# Patient Record
Sex: Female | Born: 2000 | Race: Black or African American | Hispanic: No | Marital: Single | Smoking: Former smoker
Health system: Southern US, Community
[De-identification: ages and names within clinical notes are randomized; demographics above are authoritative.]

## PROBLEM LIST (undated history)

## (undated) DIAGNOSIS — Z789 Other specified health status: Secondary | ICD-10-CM

## (undated) HISTORY — PX: NO PAST SURGERIES: SHX2092

---
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
## 2019-01-26 ENCOUNTER — Encounter (HOSPITAL_COMMUNITY): Payer: Self-pay | Admitting: *Deleted

## 2019-01-26 ENCOUNTER — Inpatient Hospital Stay (HOSPITAL_COMMUNITY)
Admission: AD | Admit: 2019-01-26 | Discharge: 2019-01-27 | Disposition: A | Discharge status: Home / Self Care | Payer: Medicaid Other | Attending: Obstetrics & Gynecology | Admitting: Obstetrics & Gynecology

## 2019-01-26 DIAGNOSIS — O034 Incomplete spontaneous abortion without complication: Secondary | ICD-10-CM | POA: Insufficient documentation

## 2019-01-26 DIAGNOSIS — O209 Hemorrhage in early pregnancy, unspecified: Secondary | ICD-10-CM | POA: Diagnosis present

## 2019-01-26 HISTORY — DX: Other specified health status: Z78.9

## 2019-01-26 LAB — POCT PREGNANCY, URINE: Preg Test, Ur: POSITIVE — AB

## 2019-01-26 NOTE — MAU Note (Signed)
Pt reports she was passing several quarter sized blood clots before she called EMS. Had a miscarriage 3 months ago and she had the same thing of passing blood clots. Had explanon removed  Before the first miscarriage (unsure when. ) has not had a period since its removal. Did not get a HPT so she is not sure if she is pregnant. Re;ports mild abd cramping

## 2019-01-27 DIAGNOSIS — O034 Incomplete spontaneous abortion without complication: Secondary | ICD-10-CM

## 2019-01-27 LAB — TYPE AND SCREEN
ABO/RH(D): A POS
Antibody Screen: POSITIVE

## 2019-01-27 LAB — CBC
HCT: 33.4 % — ABNORMAL LOW (ref 36.0–46.0)
Hemoglobin: 12 g/dL (ref 12.0–15.0)
MCH: 31.7 pg (ref 26.0–34.0)
MCHC: 35.9 g/dL (ref 30.0–36.0)
MCV: 88.1 fL (ref 80.0–100.0)
Platelets: 177 10*3/uL (ref 150–400)
RBC: 3.79 MIL/uL — ABNORMAL LOW (ref 3.87–5.11)
RDW: 12.5 % (ref 11.5–15.5)
WBC: 11 10*3/uL — ABNORMAL HIGH (ref 4.0–10.5)
nRBC: 0 % (ref 0.0–0.2)

## 2019-01-27 LAB — HCG, QUANTITATIVE, PREGNANCY: hCG, Beta Chain, Quant, S: 2400 m[IU]/mL — ABNORMAL HIGH (ref <5)

## 2019-01-27 NOTE — Discharge Instructions (Signed)
Contraception Choices Contraception, also called birth control, refers to methods or devices that prevent pregnancy. Hormonal methods Contraceptive implant  A contraceptive implant is a thin, plastic tube that contains a hormone. It is inserted into the upper part of the arm. It can remain in place for up to 3 years. Progestin-only injections Progestin-only injections are injections of progestin, a synthetic form of the hormone progesterone. They are given every 3 months by a health care provider. Birth control pills  Birth control pills are pills that contain hormones that prevent pregnancy. They must be taken once a day, preferably at the same time each day. Birth control patch  The birth control patch contains hormones that prevent pregnancy. It is placed on the skin and must be changed once a week for three weeks and removed on the fourth week. A prescription is needed to use this method of contraception. Vaginal ring  A vaginal ring contains hormones that prevent pregnancy. It is placed in the vagina for three weeks and removed on the fourth week. After that, the process is repeated with a new ring. A prescription is needed to use this method of contraception. Emergency contraceptive Emergency contraceptives prevent pregnancy after unprotected sex. They come in pill form and can be taken up to 5 days after sex. They work best the sooner they are taken after having sex. Most emergency contraceptives are available without a prescription. This method should not be used as your only form of birth control. Barrier methods Female condom  A female condom is a thin sheath that is worn over the penis during sex. Condoms keep sperm from going inside a woman's body. They can be used with a spermicide to increase their effectiveness. They should be disposed after a single use. Female condom  A female condom is a soft, loose-fitting sheath that is put into the vagina before sex. The condom keeps sperm  from going inside a woman's body. They should be disposed after a single use. Diaphragm  A diaphragm is a soft, dome-shaped barrier. It is inserted into the vagina before sex, along with a spermicide. The diaphragm blocks sperm from entering the uterus, and the spermicide kills sperm. A diaphragm should be left in the vagina for 6-8 hours after sex and removed within 24 hours. A diaphragm is prescribed and fitted by a health care provider. A diaphragm should be replaced every 1-2 years, after giving birth, after gaining more than 15 lb (6.8 kg), and after pelvic surgery. Cervical cap  A cervical cap is a round, soft latex or plastic cup that fits over the cervix. It is inserted into the vagina before sex, along with spermicide. It blocks sperm from entering the uterus. The cap should be left in place for 6-8 hours after sex and removed within 48 hours. A cervical cap must be prescribed and fitted by a health care provider. It should be replaced every 2 years. Sponge  A sponge is a soft, circular piece of polyurethane foam with spermicide on it. The sponge helps block sperm from entering the uterus, and the spermicide kills sperm. To use it, you make it wet and then insert it into the vagina. It should be inserted before sex, left in for at least 6 hours after sex, and removed and thrown away within 30 hours. Spermicides Spermicides are chemicals that kill or block sperm from entering the cervix and uterus. They can come as a cream, jelly, suppository, foam, or tablet. A spermicide should be inserted into the  vagina with an applicator at least 86-57 minutes before sex to allow time for it to work. The process must be repeated every time you have sex. Spermicides do not require a prescription. Intrauterine contraception Intrauterine device (IUD) An IUD is a T-shaped device that is put in a woman's uterus. There are two types:  Hormone IUD.This type contains progestin, a synthetic form of the hormone  progesterone. This type can stay in place for 3-5 years.  Copper IUD.This type is wrapped in copper wire. It can stay in place for 10 years.  Permanent methods of contraception Female tubal ligation In this method, a woman's fallopian tubes are sealed, tied, or blocked during surgery to prevent eggs from traveling to the uterus. Hysteroscopic sterilization In this method, a small, flexible insert is placed into each fallopian tube. The inserts cause scar tissue to form in the fallopian tubes and block them, so sperm cannot reach an egg. The procedure takes about 3 months to be effective. Another form of birth control must be used during those 3 months. Female sterilization This is a procedure to tie off the tubes that carry sperm (vasectomy). After the procedure, the man can still ejaculate fluid (semen). Natural planning methods Natural family planning In this method, a couple does not have sex on days when the woman could become pregnant. Calendar method This means keeping track of the length of each menstrual cycle, identifying the days when pregnancy can happen, and not having sex on those days. Ovulation method In this method, a couple avoids sex during ovulation. Symptothermal method This method involves not having sex during ovulation. The woman typically checks for ovulation by watching changes in her temperature and in the consistency of cervical mucus. Post-ovulation method In this method, a couple waits to have sex until after ovulation. Summary  Contraception, also called birth control, means methods or devices that prevent pregnancy.  Hormonal methods of contraception include implants, injections, pills, patches, vaginal rings, and emergency contraceptives.  Barrier methods of contraception can include female condoms, female condoms, diaphragms, cervical caps, sponges, and spermicides.  There are two types of IUDs (intrauterine devices). An IUD can be put in a woman's uterus to  prevent pregnancy for 3-5 years.  Permanent sterilization can be done through a procedure for males, females, or both.  Natural family planning methods involve not having sex on days when the woman could become pregnant. This information is not intended to replace advice given to you by your health care provider. Make sure you discuss any questions you have with your health care provider. Document Released: 05/31/2005 Document Revised: 06/02/2017 Document Reviewed: 07/03/2016 Elsevier Patient Education  2020 [place] A miscarriage is the loss of an unborn baby (fetus) before the 20th week of pregnancy. Most miscarriages happen during the first 3 months of pregnancy. Sometimes, a miscarriage can happen before a woman knows that she is pregnant. Having a miscarriage can be an emotional experience. If you have had a miscarriage, talk with your health care provider about any questions you may have about miscarrying, the grieving process, and your plans for future pregnancy. What are the causes? A miscarriage may be caused by:  Problems with the genes or chromosomes of the fetus. These problems make it impossible for the baby to develop normally. They are often the result of random errors that occur early in the development of the baby, and are not passed from parent to child (not inherited).  Infection of the cervix or uterus.  Conditions that affect hormone balance in the body.  Problems with the cervix, such as the cervix opening and thinning before pregnancy is at term (cervical insufficiency).  Problems with the uterus. These may include: ? A uterus with an abnormal shape. ? Fibroids in the uterus. ? Congenital abnormalities. These are problems that were present at birth.  Certain medical conditions.  Smoking, drinking alcohol, or using drugs.  Injury (trauma). In many cases, the cause of a miscarriage is not known. What are the signs or symptoms? Symptoms of this  condition include:  Vaginal bleeding or spotting, with or without cramps or pain.  Pain or cramping in the abdomen or lower back.  Passing fluid, tissue, or blood clots from the vagina. How is this diagnosed? This condition may be diagnosed based on:  A physical exam.  Ultrasound.  Blood tests.  Urine tests. How is this treated? Treatment for a miscarriage is sometimes not necessary if you naturally pass all the tissue that was in your uterus. If necessary, this condition may be treated with:  Dilation and curettage (D&C). This is a procedure in which the cervix is stretched open and the lining of the uterus (endometrium) is scraped. This is done only if tissue from the fetus or placenta remains in the body (incomplete miscarriage).  Medicines, such as: ? Antibiotic medicine, to treat infection. ? Medicine to help the body pass any remaining tissue. ? Medicine to reduce (contract) the size of the uterus. These medicines may be given if you have a lot of bleeding. If you have Rh negative blood and your baby was Rh positive, you will need a shot of a medicine called Rh immunoglobulinto protect your future babies from Rh blood problems. "Rh-negative" and "Rh-positive" refer to whether or not the blood has a specific protein found on the surface of red blood cells (Rh factor). Follow these instructions at home: Medicines   Take over-the-counter and prescription medicines only as told by your health care provider.  If you were prescribed antibiotic medicine, take it as told by your health care provider. Do not stop taking the antibiotic even if you start to feel better.  Do not take NSAIDs, such as aspirin and ibuprofen, unless they are approved by your health care provider. These medicines can cause bleeding. Activity  Rest as directed. Ask your health care provider what activities are safe for you.  Have someone help with home and family responsibilities during this  time. General instructions  Keep track of the number of sanitary pads you use each day and how soaked (saturated) they are. Write down this information.  Monitor the amount of tissue or blood clots that you pass from your vagina. Save any large amounts of tissue for your health care provider to examine.  Do not use tampons, douche, or have sex until your health care provider approves.  To help you and your partner with the process of grieving, talk with your health care provider or seek counseling.  When you are ready, meet with your health care provider to discuss any important steps you should take for your health. Also, discuss steps you should take to have a healthy pregnancy in the future.  Keep all follow-up visits as told by your health care provider. This is important. Where to find more information  The American Congress of Obstetricians and Gynecologists: www.acog.org  U.S. Department of Health and [employer] Services Office of Womens Health: http://hoffman.com/www.womenshealth.gov Contact a health care provider if:  You have a  fever or chills.  You have a foul smelling vaginal discharge.  You have more bleeding instead of less. Get help right away if:  You have severe cramps or pain in your back or abdomen.  You pass blood clots or tissue from your vagina that is walnut-sized or larger.  You soak more than 1 regular sanitary pad in an hour.  You become light-headed or weak.  You pass out.  You have feelings of sadness that take over your thoughts, or you have thoughts of hurting yourself. Summary  Most miscarriages happen in the first 3 months of pregnancy. Sometimes miscarriage happens before a woman even knows that she is pregnant.  Follow your health care provider's instruction for home care. Keep all follow-up appointments.  To help you and your partner with the process of grieving, talk with your health care provider or seek counseling. This information is not intended to  replace advice given to you by your health care provider. Make sure you discuss any questions you have with your health care provider. Document Released: 11/24/2000 Document Revised: 09/22/2018 Document Reviewed: 07/06/2016 Elsevier Patient Education  2020 [employer]

## 2019-01-27 NOTE — Progress Notes (Signed)
Spec exam done. POC in cervix and removed with  ring forceps. Pt tol well. Pericare afterward. POC discussed with pt and understanding voiced

## 2019-01-27 NOTE — MAU Provider Note (Signed)
History     CSN: [id number]  Arrival date and time: 01/26/19 2325   First Provider Initiated Contact with Patient 01/27/19 0107      Chief Complaint  Patient presents with  . Vaginal Bleeding    Bethany Decker is a 18 y.o. G2P0010 at unknown gestation.  She presents today for Vaginal Bleeding that started yesterday around 1030-11pm.  She states she had heavy bleeding with clots that were the size of a quarter.  Patient states she was having bad abdominal cramping that has since improved without intervention.  Patient states she took tylenol for the cramping, which started prior to the bleeding.  Patient states "I am having a miscarriage and I know because I had one before."  She currently rates the pain a 4/10 and continues to report cramping.      OB History    Gravida  2   Para      Term      Preterm      AB  1   Living        SAB  1   TAB      Ectopic      Multiple      Live Births              Past Medical History:  Diagnosis Date  . Medical history non-contributory     Past Surgical History:  Procedure Laterality Date  . NO PAST SURGERIES      No family history on file.  Social History   Tobacco Use  . Smoking status: Never Smoker  . Smokeless tobacco: Never Used  Substance Use Topics  . Alcohol use: Never    Frequency: Never  . Drug use: Never    Allergies: No Known Allergies  Medications Prior to Admission  Medication Sig Dispense Refill Last Dose  . ibuprofen (ADVIL,MOTRIN) 600 MG tablet Take 1 tablet (600 mg total) by mouth every 6 (six) hours as needed. (Patient not taking: Reported on 02/10/2018) 30 tablet 0   . phenol (CHLORASEPTIC) 1.4 % LIQD Use as directed 1 spray in the mouth or throat as needed for throat irritation / pain. (Patient not taking: Reported on 02/10/2018) 1 Bottle 0     Review of Systems  Constitutional: Negative for chills and fever.  Respiratory: Negative for cough and shortness of breath.    Gastrointestinal: Positive for abdominal pain. Negative for constipation, diarrhea, nausea and vomiting.  Genitourinary: Positive for vaginal bleeding. Negative for difficulty urinating, dysuria and vaginal discharge.  Neurological: Positive for dizziness. Negative for light-headedness and headaches.   Physical Exam   Blood pressure (!) 114/57, pulse (!) 58, temperature 98.5 F (36.9 C), resp. rate 18.  Physical Exam  Constitutional: She is oriented to person, place, and time. She appears well-developed and well-nourished. No distress.  HENT:  Head: Normocephalic and atraumatic.  Eyes: Conjunctivae are normal.  Neck: Normal range of motion.  Cardiovascular: Normal rate.  Respiratory: Effort normal.  Genitourinary: Cervix exhibits no motion tenderness.    No vaginal discharge or bleeding.  No bleeding in the vagina.    Genitourinary Comments: Speculum Exam: -Normal External Genitalia: Non tender, Blood noted at introitus.  -Vaginal Vault: Pink mucosa with good rugae. Suspected POC in vault. -Cervix:Suspected POC noted from Os.  Able to gently tease out with ring forceps and patient efforts.  After removal, no active bleeding noted.  Os appears closed. -Bimanual Exam:  Deferred   Musculoskeletal: Normal range of motion.  Neurological:  She is alert and oriented to person, place, and time.  Skin: Skin is warm and dry.  Psychiatric: She has a normal mood and affect. Her behavior is normal.    MAU Course  Procedures Results for orders placed or performed during the hospital encounter of 01/26/19 (from the past 24 hour(s))  Pregnancy, urine POC     Status: Abnormal   Collection Time: 01/26/19 11:41 PM  Result Value Ref Range   Preg Test, Ur POSITIVE (A) NEGATIVE  Type and screen     Status: None (Preliminary result)   Collection Time: 01/27/19  1:37 AM  Result Value Ref Range   ABO/RH(D) PENDING    Antibody Screen PENDING    Sample Expiration      01/30/2019,2359 Performed at  [facility], [place] [address] [place], [place] [address]   CBC     Status: Abnormal   Collection Time: 01/27/19  1:37 AM  Result Value Ref Range   WBC 11.0 (H) 4.0 - 10.5 K/uL   RBC 3.79 (L) 3.87 - 5.11 MIL/uL   Hemoglobin 12.0 12.0 - 15.0 g/dL   HCT 33.4 (L) 36.0 - 46.0 %   MCV 88.1 80.0 - 100.0 fL   MCH 31.7 26.0 - 34.0 pg   MCHC 35.9 30.0 - 36.0 g/dL   RDW 12.5 11.5 - 15.5 %   Platelets 177 150 - 400 K/uL   nRBC 0.0 0.0 - 0.2 %    MDM Pelvic Exam Labs: CBC, hCG, T&S, UA Assessment and Plan  18 year old G2P0010 Incomplete Abortion  -Exam findings discussed. -Informed that TVUS not necessary as miscarriage was completed by provider at bedside. -POC appear intact. -Further informed that POC will be sent to lab for evaluation and patient would be contacted for any abnormal findings. -Instructed to monitor bleeding. -Discussed need for close follow up including repeat quant in 48 hours. -Scheduled for Quant on Monday Aug 17th at 11am. -Will await lab results to confirm [place] MSN, CNM 01/27/2019, 1:07 AM   Reassessment (2:47 AM) A Positive  -Patient informed of results. -Reiterated need for close follow up. -Patient reports that she does not desire pregnancy. -Contraception information given. -Encouraged to call or return to MAU if symptoms worsen or with the onset of new symptoms. -Discharged to home in stable condition.  Maryann Conners MSN, CNM

## 2019-01-29 ENCOUNTER — Ambulatory Visit: Payer: Medicaid Other

## 2019-02-01 NOTE — Progress Notes (Signed)
Products of Conception Confirmed

## 2019-04-19 ENCOUNTER — Other Ambulatory Visit: Payer: Self-pay

## 2019-04-19 ENCOUNTER — Inpatient Hospital Stay (HOSPITAL_COMMUNITY)
Admission: AD | Admit: 2019-04-19 | Discharge: 2019-04-19 | Disposition: A | Discharge status: Home / Self Care | Payer: Medicaid Other | Attending: Obstetrics & Gynecology | Admitting: Obstetrics & Gynecology

## 2019-04-19 DIAGNOSIS — Z3202 Encounter for pregnancy test, result negative: Secondary | ICD-10-CM | POA: Diagnosis not present

## 2019-04-19 DIAGNOSIS — N939 Abnormal uterine and vaginal bleeding, unspecified: Secondary | ICD-10-CM | POA: Diagnosis not present

## 2019-04-19 LAB — POCT PREGNANCY, URINE: Preg Test, Ur: NEGATIVE

## 2019-04-19 LAB — HCG, SERUM, QUALITATIVE: Preg, Serum: NEGATIVE

## 2019-04-19 NOTE — MAU Note (Signed)
.   Bethany Decker is a 18 y.o. at Unknown here in MAU reporting: that she started having vaginal bleeding last night.  Irregular cycles. Has not taken a HPT LMP: 03/19/19 Onset of complaint: last night Pain score: 0 Vitals:   04/19/19 1513  BP: 124/90  Pulse: 73  Resp: 16  Temp: 98 F (36.7 C)  SpO2: 100%     FHT: Lab orders placed from triage: UPT

## 2019-04-19 NOTE — MAU Provider Note (Signed)
History     CSN: [id number]  Arrival date and time: 04/19/19 1445   First Provider Initiated Contact with Patient 04/19/19 1528      Chief Complaint  Patient presents with  . Vaginal Bleeding  . Possible Pregnancy   HPI  [name] is a 18 y.o. [id number] non-pregnant female who presents to MAU today with complaint of vaginal bleeding since last night. She states she had a Nexplanon removed last year and has not had a period since then. She had a miscarriage in August and this is the first episode of bleeding since that. She did not take HPT prior to arrival. She is sexually active and not using anything for birth control. She denies pain. She has used 3 pads today.   OB History    Gravida  2   Para      Term      Preterm      AB  1   Living        SAB  1   TAB      Ectopic      Multiple      Live Births              Past Medical History:  Diagnosis Date  . Medical history non-contributory     Past Surgical History:  Procedure Laterality Date  . NO PAST SURGERIES      No family history on file.  Social History   Tobacco Use  . Smoking status: Never Smoker  . Smokeless tobacco: Never Used  Substance Use Topics  . Alcohol use: Never    Frequency: Never  . Drug use: Never    Allergies: No Known Allergies  Medications Prior to Admission  Medication Sig Dispense Refill Last Dose  . ibuprofen (ADVIL,MOTRIN) 600 MG tablet Take 1 tablet (600 mg total) by mouth every 6 (six) hours as needed. (Patient not taking: Reported on 02/10/2018) 30 tablet 0     Review of Systems  Gastrointestinal: Negative for abdominal pain.  Genitourinary: Positive for vaginal bleeding. Negative for vaginal discharge.   Physical Exam   Blood pressure 124/90, pulse 73, temperature 98 F (36.7 C), resp. rate 16, weight 62.1 kg, last menstrual period 03/19/2019, SpO2 100 %.  Physical Exam  Nursing note and vitals reviewed. Constitutional: She is oriented to  person, place, and time. She appears well-developed and well-nourished. No distress.  HENT:  Head: Normocephalic and atraumatic.  Cardiovascular: Normal rate.  Respiratory: Effort normal.  GI: Soft. She exhibits no distension.  Neurological: She is alert and oriented to person, place, and time.  Skin: Skin is warm and dry. No erythema.  Psychiatric: She has a normal mood and affect.     Results for orders placed or performed during the hospital encounter of 04/19/19 (from the past 24 hour(s))  Pregnancy, urine POC     Status: None   Collection Time: 04/19/19  3:21 PM  Result Value Ref Range   Preg Test, Ur NEGATIVE NEGATIVE    MAU Course  Procedures None  MDM UPT - negative Qualitative hCG drawn today  Unlikely to be positive, patient advised that she may leave and I will contact with results. If positive, patient understands that she will need to return for further evaluation.   Assessment and Plan  A: Negative pregnancy test  Vaginal bleeding   P:  Discharge home Patient advised that I will contact her with the results of serum hCG later today  Patient  advised to follow-up with [facility] for further work-up of irregular periods Patient may return to MAU as needed or if her condition were to change or worsen  Vonzella Nipple, PA-C 04/19/2019, 3:42 PM

## 2019-04-19 NOTE — Discharge Instructions (Signed)
Abnormal Uterine Bleeding Abnormal uterine bleeding is unusual bleeding from the uterus. It includes:  Bleeding or spotting between periods.  Bleeding after sex.  Bleeding that is heavier than normal.  Periods that last longer than usual.  Bleeding after menopause. Abnormal uterine bleeding can affect women at various stages in life, including teenagers, women in their reproductive years, pregnant women, and women who have reached menopause. Common causes of abnormal uterine bleeding include:  Pregnancy.  Growths of tissue (polyps).  A noncancerous tumor in the uterus (fibroid).  Infection.  Cancer.  Hormonal imbalances. Any type of abnormal bleeding should be evaluated by a health care provider. Many cases are minor and simple to treat, while others are more serious. Treatment will depend on the cause of the bleeding. Follow these instructions at home:  Monitor your condition for any changes.  Do not use tampons, douche, or have sex if told by your health care provider.  Change your pads often.  Get regular exams that include pelvic exams and cervical cancer screening.  Keep all follow-up visits as told by your health care provider. This is important. Contact a health care provider if:  Your bleeding lasts for more than one week.  You feel dizzy at times.  You feel nauseous or you vomit. Get help right away if:  You pass out.  Your bleeding soaks through a pad every hour.  You have abdominal pain.  You have a fever.  You become sweaty or weak.  You pass large blood clots from your vagina. Summary  Abnormal uterine bleeding is unusual bleeding from the uterus.  Any type of abnormal bleeding should be evaluated by a health care provider. Many cases are minor and simple to treat, while others are more serious.  Treatment will depend on the cause of the bleeding. This information is not intended to replace advice given to you by your health care provider.  Make sure you discuss any questions you have with your health care provider. Document Released: 05/31/2005 Document Revised: 09/07/2017 Document Reviewed: 07/02/2016 Elsevier Patient Education  2020 [place] Pregnancy Test Information  A home pregnancy test helps you determine whether you are pregnant or not. There are several types of home pregnancy tests that can be bought at a grocery store or pharmacy. What is being tested? A home pregnancy test detects the presence of a hormone in your urine. The hormone is produced by cells of the placenta (human chorionic gonadotropin, or hCG). The placenta is the organ that forms to nourish and support a developing baby. How are pregnancy tests done? Home pregnancy tests require a urine sample.  Most kits use a plastic testing device with a strip of paper that indicates whether there is hCG in your urine.  Follow the test package instructions very carefully for how to test your urine. Depending on the test, you may need to: ? Urinate directly onto the stick. ? Urinate into a cup.  Wait for the results as directed by the package instructions. The amount of time may be different for each type of test.  Follow the test package instructions for how to read your test results. Depending on the test, results may be displayed as: ? A plus or a minus sign. ? One or two lines. ? "Pregnant" or "not pregnant."  For best results, use your first urine of the morning. That is when the concentration of hCG is highest. How accurate are home pregnancy tests? Home pregnancy tests are very  accurate when:  You are at least 3-[redacted] weeks pregnant.  It has been 1-2 weeks since your missed period.  You use the test according to the package instructions. What can interfere with home pregnancy test results? Sometimes, a home pregnancy test may report that you are pregnant when you are not pregnant (false-positive result). This can happen if you:  Are  taking certain medicines, such as: ? Medicine to control seizures. ? Anti-anxiety medicine. ? Fertility medicine with hCG.  Have a medical condition that affects your hormone levels.  Had a recent pregnancy loss (miscarriage) or abortion. Sometimes, a home pregnancy test may report that you are not pregnant when you are pregnant (false-negative result). This can happen if you:  Took the test too early in your pregnancy. Before 3-4 weeks of pregnancy, there may not be enough hCG to detect.  Drank a lot of liquid before the test.  Used an expired pregnancy test.  Are taking certain medicines, such as antihistamines or water pills (diuretics). What should I do if I have a positive pregnancy test? If you have a positive home pregnancy test, schedule an appointment with your health care provider. You might need additional testing to confirm the pregnancy. What should I do if I have a negative pregnancy test? If you have a negative home pregnancy test but still have symptoms of pregnancy, contact your health care provider. Your health care provider will test a sample of your blood to check for pregnancy. In some cases, a blood test will return a positive result even if a urine test was negative because blood tests are more sensitive. This means blood tests can detect hCG earlier than home pregnancy tests. Follow these instructions at home: If you are pregnant, planning to become pregnant, or think you may be pregnant:  Do not drink alcohol.  Do not use street drugs.  Do not use any products that contain nicotine or tobacco, such as cigarettes and e-cigarettes. If you need help quitting, ask your health care provider.  Take a prenatal vitamin that contains at least 400 mcg of folic acid daily. Summary  A home pregnancy test helps you determine whether you are pregnant or not by detecting the presence of the hormone human chorionic gonadotropin (hCG) in a sample of your urine.  Follow the  test package instructions very carefully. For best results, use your first urine of the morning. That is when the concentration of hCG is highest.  Home pregnancy tests are very accurate when you are 3-[redacted] weeks pregnant or when it has been 1-2 weeks since your missed period.  A home pregnancy test may report that you are pregnant when you are not pregnant or that you are not pregnant when you are pregnant.  Contact your health care provider to confirm your results. Your health care provider will test a sample of your blood to check for pregnancy. This information is not intended to replace advice given to you by your health care provider. Make sure you discuss any questions you have with your health care provider. Document Released: 06/03/2003 Document Revised: 09/21/2018 Document Reviewed: 06/13/2017 Elsevier Patient Education  2020 [employer].

## 2019-04-20 ENCOUNTER — Telehealth: Payer: Self-pay | Admitting: Medical

## 2019-04-20 NOTE — Telephone Encounter (Signed)
Called Bethany Decker and confirmed patient identity. Informed patient of negative hCG yesterday in MAU as expected. Patient advised to follow-up with [facility] for further evaluation of irregular periods.   Luvenia Redden, PA-C 04/20/2019 8:33 AM

## 2019-06-12 ENCOUNTER — Other Ambulatory Visit: Payer: Self-pay

## 2019-06-12 ENCOUNTER — Encounter (HOSPITAL_COMMUNITY): Payer: Self-pay

## 2019-06-12 ENCOUNTER — Emergency Department (HOSPITAL_COMMUNITY)
Admission: EM | Admit: 2019-06-12 | Discharge: 2019-06-16 | Disposition: A | Discharge status: Other Acute Inpatient Hospital | Payer: Medicaid Other | Attending: Emergency Medicine | Admitting: Emergency Medicine

## 2019-06-12 DIAGNOSIS — F23 Brief psychotic disorder: Secondary | ICD-10-CM | POA: Insufficient documentation

## 2019-06-12 DIAGNOSIS — Z20822 Contact with and (suspected) exposure to covid-19: Secondary | ICD-10-CM | POA: Insufficient documentation

## 2019-06-12 DIAGNOSIS — F29 Unspecified psychosis not due to a substance or known physiological condition: Secondary | ICD-10-CM

## 2019-06-12 DIAGNOSIS — G9340 Encephalopathy, unspecified: Secondary | ICD-10-CM | POA: Insufficient documentation

## 2019-06-12 DIAGNOSIS — T424X4A Poisoning by benzodiazepines, undetermined, initial encounter: Secondary | ICD-10-CM

## 2019-06-12 DIAGNOSIS — R45851 Suicidal ideations: Secondary | ICD-10-CM

## 2019-06-12 LAB — CBC WITH DIFFERENTIAL/PLATELET
Abs Immature Granulocytes: 0.02 10*3/uL (ref 0.00–0.07)
Basophils Absolute: 0 10*3/uL (ref 0.0–0.1)
Basophils Relative: 0 %
Eosinophils Absolute: 0 10*3/uL (ref 0.0–0.5)
Eosinophils Relative: 0 %
HCT: 35.3 % — ABNORMAL LOW (ref 36.0–46.0)
Hemoglobin: 11.6 g/dL — ABNORMAL LOW (ref 12.0–15.0)
Immature Granulocytes: 0 %
Lymphocytes Relative: 16 %
Lymphs Abs: 1.1 10*3/uL (ref 0.7–4.0)
MCH: 30.3 pg (ref 26.0–34.0)
MCHC: 32.9 g/dL (ref 30.0–36.0)
MCV: 92.2 fL (ref 80.0–100.0)
Monocytes Absolute: 0.4 10*3/uL (ref 0.1–1.0)
Monocytes Relative: 6 %
Neutro Abs: 5.3 10*3/uL (ref 1.7–7.7)
Neutrophils Relative %: 78 %
Platelets: 186 10*3/uL (ref 150–400)
RBC: 3.83 MIL/uL — ABNORMAL LOW (ref 3.87–5.11)
RDW: 12.3 % (ref 11.5–15.5)
WBC: 6.9 10*3/uL (ref 4.0–10.5)
nRBC: 0 % (ref 0.0–0.2)

## 2019-06-12 LAB — CBG MONITORING, ED: Glucose-Capillary: 83 mg/dL (ref 70–99)

## 2019-06-12 MED ORDER — LACTATED RINGERS IV BOLUS
2000.0000 mL | Freq: Once | INTRAVENOUS | Status: AC
  Administered 2019-06-12: 2000 mL via INTRAVENOUS
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
## 2019-06-13 ENCOUNTER — Encounter (HOSPITAL_COMMUNITY): Payer: Self-pay | Admitting: Behavioral Health

## 2019-06-13 ENCOUNTER — Emergency Department (HOSPITAL_COMMUNITY): Payer: Medicaid Other

## 2019-06-13 ENCOUNTER — Encounter (HOSPITAL_COMMUNITY): Payer: Self-pay | Admitting: Radiology

## 2019-06-13 LAB — COMPREHENSIVE METABOLIC PANEL
ALT: 15 U/L (ref 0–44)
AST: 19 U/L (ref 15–41)
Albumin: 3.9 g/dL (ref 3.5–5.0)
Alkaline Phosphatase: 47 U/L (ref 38–126)
Anion gap: 11 (ref 5–15)
BUN: 10 mg/dL (ref 6–20)
CO2: 23 mmol/L (ref 22–32)
Calcium: 8.7 mg/dL — ABNORMAL LOW (ref 8.9–10.3)
Chloride: 104 mmol/L (ref 98–111)
Creatinine, Ser: 0.9 mg/dL (ref 0.44–1.00)
GFR calc Af Amer: >60 mL/min (ref >60)
GFR calc non Af Amer: >60 mL/min (ref >60)
Glucose, Bld: 92 mg/dL (ref 70–99)
Potassium: 3 mmol/L — ABNORMAL LOW (ref 3.5–5.1)
Sodium: 138 mmol/L (ref 135–145)
Total Bilirubin: 0.6 mg/dL (ref 0.3–1.2)
Total Protein: 7.1 g/dL (ref 6.5–8.1)

## 2019-06-13 LAB — RESPIRATORY PANEL BY RT PCR (FLU A&B, COVID)
Influenza A by PCR: NEGATIVE
Influenza B by PCR: NEGATIVE
SARS Coronavirus 2 by RT PCR: NEGATIVE

## 2019-06-13 LAB — RAPID URINE DRUG SCREEN, HOSP PERFORMED
Amphetamines: NOT DETECTED
Barbiturates: NOT DETECTED
Benzodiazepines: NOT DETECTED
Cocaine: NOT DETECTED
Opiates: NOT DETECTED
Tetrahydrocannabinol: POSITIVE — AB

## 2019-06-13 LAB — SALICYLATE LEVEL: Salicylate Lvl: <7 mg/dL — ABNORMAL LOW (ref 7.0–30.0)

## 2019-06-13 LAB — HCG, QUANTITATIVE, PREGNANCY: hCG, Beta Chain, Quant, S: <1 m[IU]/mL (ref <5)

## 2019-06-13 LAB — POC URINE PREG, ED: Preg Test, Ur: NEGATIVE

## 2019-06-13 LAB — ACETAMINOPHEN LEVEL: Acetaminophen (Tylenol), Serum: <10 ug/mL — ABNORMAL LOW (ref 10–30)

## 2019-06-13 LAB — ETHANOL: Alcohol, Ethyl (B): <10 mg/dL (ref <10)

## 2019-06-13 MED ORDER — POTASSIUM CHLORIDE 10 MEQ/100ML IV SOLN
10.0000 meq | INTRAVENOUS | Status: AC
  Administered 2019-06-13 (×2): 10 meq via INTRAVENOUS
  Filled 2019-06-13 (×2): qty 100

## 2019-06-13 MED ORDER — MAGNESIUM SULFATE 2 GM/50ML IV SOLN
2.0000 g | Freq: Once | INTRAVENOUS | Status: AC
  Administered 2019-06-13: 01:00:00 2 g via INTRAVENOUS
  Filled 2019-06-13: qty 50

## 2019-06-13 NOTE — ED Notes (Addendum)
Patient urinated in bed. Patient washed up and linens changed. Purewick repositioned in place.
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
## 2019-06-13 NOTE — BH Assessment (Signed)
[place] Assessment Progress Note    TTS attempted to see patient, but RN indicated that patient could not be seen for a couple of hours due to her current state of mind.

## 2019-06-13 NOTE — ED Notes (Signed)
Patient transported to MRI via sitter.

## 2019-06-13 NOTE — BH Assessment (Signed)
[place] Assessment Progress Note     Per Marvia Pickles, NP, patient meets inpatient admission criteria, but [facility] does not have an appropriate bed for her tonight.  Will seek outside placement as patient waits to see if a bed becomes available at [facility]
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
## 2019-06-13 NOTE — ED Notes (Signed)
Per MRI staff, patient swung on them and would not cooperate, would not stay still for the scan. Some words are comprehensible now, mostly cuss words and patient is still aggressive, swinging on staff and peeing in the floor again.

## 2019-06-13 NOTE — ED Notes (Signed)
Poison control called to check on her status.
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
## 2019-06-13 NOTE — ED Notes (Signed)
This nurse found patient sitting on the side of the bed. Patient still lethargic and unable to answer questions at this time. Patient agitated and showing signs of aggression. Patient assisted back into bed and monitor replaced. Sitter at bedside.

## 2019-06-13 NOTE — ED Notes (Addendum)
Pt arrived to unit with sitter and nurse.  Patient is very drowsy and unable to communicate,  She is slurring her words and very quiet.  Pt is kind of aggressive swinging at staff when they try to help her.  She appears to be seeing things that are not there.

## 2019-06-13 NOTE — ED Notes (Signed)
Her mother called to check on her status. She is asleep and writer did not want to wake her up. Mom will call back in am after 10a

## 2019-06-13 NOTE — Progress Notes (Signed)
Attempted pt for MRi, patient became combative as we attempted to remove EKG stickers and pulse ox off finger and began screaming. Patient was taken back to ED.

## 2019-06-13 NOTE — ED Notes (Signed)
Spoke with the patient's father, he does not know her medical history, she does not live with him, incomprehensible speech is not her baseline and he gives consent for the MRI.

## 2019-06-14 DIAGNOSIS — T424X4A Poisoning by benzodiazepines, undetermined, initial encounter: Secondary | ICD-10-CM | POA: Insufficient documentation

## 2019-06-14 DIAGNOSIS — R45851 Suicidal ideations: Secondary | ICD-10-CM

## 2019-06-14 DIAGNOSIS — R4182 Altered mental status, unspecified: Secondary | ICD-10-CM

## 2019-06-14 MED ORDER — HALOPERIDOL LACTATE 5 MG/ML IJ SOLN
INTRAMUSCULAR | Status: AC
  Filled 2019-06-14: qty 1

## 2019-06-14 MED ORDER — HALOPERIDOL LACTATE 5 MG/ML IJ SOLN: 5.0000 mg | Freq: Once | INTRAMUSCULAR | Status: DC

## 2019-06-14 MED ORDER — POTASSIUM CHLORIDE 20 MEQ/15ML (10%) PO SOLN
40.0000 meq | Freq: Once | ORAL | Status: AC
  Administered 2019-06-14: 20:00:00 40 meq via ORAL
  Filled 2019-06-14: qty 30

## 2019-06-14 NOTE — ED Notes (Signed)
Pt went to bathroom and urinated on the floor. Pt still seems confused and hard to redirect. Took 2 people to help this writer change soiled pants. Pt sitting on the floor talking to self at this time. Will continue to monitor.

## 2019-06-14 NOTE — BH Assessment (Signed)
[facility] Assessment Progress Note  Per Merlyn Lot, NP, this pt requires psychiatric hospitalization at this time.  Pt presents under IVC initiated by EDP Gerre Couch, MD.  Placement will be sought for pt.   Jalene Mullet, [place] Coordinator [phone]

## 2019-06-14 NOTE — ED Notes (Signed)
In bed resting; Respirations even and unlabored.  Eyes closed.

## 2019-06-14 NOTE — ED Notes (Signed)
Sitting on floor of her threshold to her room Security and staff present to redirect her when she tries to leave her room. She does comply with security.

## 2019-06-14 NOTE — ED Notes (Signed)
Resistant to vitals but able to get BP and pulse but she was moving during it. Not able to get her temp. BP elevated. Has not slept since getting IM Haldol.

## 2019-06-14 NOTE — ED Notes (Signed)
Up and came out of her room and started down the hall. Redirected to her room. Bed is wet with urine. Changed linen and tried to get her to take a shower but sat on the floor and would not move. Does not seem to be understanding the request. Abandoned shower idea and trying to get her to change her clothes. Very malodorous.

## 2019-06-14 NOTE — ED Notes (Signed)
Rose with poison control cleared pt.
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
## 2019-06-14 NOTE — Consult Note (Addendum)
Bethany Decker, 18 y.o., female patient presented to [facility] for suicide attempt.  Chart reviewed and consulted with Dr. Mallie Darting on 06/14/19.  The writer unable to evaluate patient but received collateral information from nursing staff.  Patient currently sleeping, respirations were even and unlabored but patient was "snoring".   Earlier today she was found wandering into other patient's rooms, not easily redirected and demonstrating disorganized behavior and had difficulty following verbal commands.  She continues to meet criteria for inpatient admission. She is currently awaiting placement.

## 2019-06-14 NOTE — ED Notes (Signed)
Staff encouraged pt to take a shower, but she was too disorganized to be able to bath herself. With difficulty staff were able to encourage her to dress.

## 2019-06-14 NOTE — ED Notes (Signed)
Techs able to return her to bed after helping her to change her scrubs. Sleeping at this time.

## 2019-06-14 NOTE — ED Notes (Signed)
Awake and wanting to use the phone to call her mom. She sat down on the floor at the nurses station but did get up on own with staffs direction. Disorganized and difficult to understand. Soft speaker and psychotic.

## 2019-06-14 NOTE — ED Notes (Signed)
Went into another peers room and sat on her bed. Not following direction. Escorted out with presence of two staff people. Security called and one of the officers sitting in her doorway to keep her from coming out. Dr Dayna Barker came to see her at that time and ordered Haldol IM. Writer had called to [facility] to speak to NP on call but she did not feel comfortable making a recommendation due to her earlier suspected overdose.  Injection of Haldol given as ordered with the assist of security to hold her hands.

## 2019-06-14 NOTE — ED Notes (Signed)
Pt is awake.

## 2019-06-14 NOTE — ED Notes (Signed)
Frequently touching her IV port. Removed per staff since no longer getting IV fluids.

## 2019-06-15 NOTE — ED Notes (Signed)
Pt walked to bathroom and seems more like herself this morning. Still no appetite at this time. Provided warm blanket and pt resting comfortably.

## 2019-06-15 NOTE — ED Notes (Signed)
Pt calm, cooperative, sleeping most of shift. Pt has been offer food during shift, pt encouraged to eat, pt did not eat.
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
## 2019-06-15 NOTE — Progress Notes (Signed)
Received Bethany Decker asleep in her bed at the change of shift with the sitter at the bedside. She continued to sleep throughout the evening and night.

## 2019-06-15 NOTE — BH Assessment (Signed)
BHH Assessment Progress Note  Per Berneice Heinrich, FNP, this pt requires psychiatric hospitalization at this time.  Pt presents under IVC initiated by EDP Alvira Monday, MD.  The following facilities have been contacted to seek placement for this pt, with results as noted:  Beds available, information sent, decision pending: [facility]   Declined: [facility] (due to pt and unit acuity)   Doylene Canning, MA Behavioral Health Coordinator [phone]

## 2019-06-15 NOTE — Progress Notes (Signed)
Was informed by dayshift RN that patient didn't eat or drink anything all day. For me, she ate 2 cheese sticks, a few saltine crackers and some crangrape juice. Will continue to monitor.

## 2019-06-16 ENCOUNTER — Other Ambulatory Visit: Payer: Self-pay

## 2019-06-16 ENCOUNTER — Inpatient Hospital Stay (HOSPITAL_COMMUNITY)
Admission: AD | Admit: 2019-06-16 | Discharge: 2019-06-20 | DRG: 881 | Disposition: A | Discharge status: Home / Self Care | Payer: Medicaid Other | Source: Intra-hospital | Attending: Psychiatry | Admitting: Psychiatry

## 2019-06-16 ENCOUNTER — Encounter (HOSPITAL_COMMUNITY): Payer: Self-pay | Admitting: Family

## 2019-06-16 DIAGNOSIS — F329 Major depressive disorder, single episode, unspecified: Secondary | ICD-10-CM | POA: Diagnosis present

## 2019-06-16 DIAGNOSIS — F332 Major depressive disorder, recurrent severe without psychotic features: Secondary | ICD-10-CM | POA: Diagnosis not present

## 2019-06-16 DIAGNOSIS — T50902A Poisoning by unspecified drugs, medicaments and biological substances, intentional self-harm, initial encounter: Secondary | ICD-10-CM | POA: Diagnosis not present

## 2019-06-16 DIAGNOSIS — T424X2A Poisoning by benzodiazepines, intentional self-harm, initial encounter: Secondary | ICD-10-CM | POA: Diagnosis present

## 2019-06-16 DIAGNOSIS — R1310 Dysphagia, unspecified: Secondary | ICD-10-CM | POA: Diagnosis present

## 2019-06-16 MED ORDER — MAGNESIUM HYDROXIDE 400 MG/5ML PO SUSP: 30.0000 mL | Freq: Every day | ORAL | Status: DC | PRN

## 2019-06-16 MED ORDER — ALUM & MAG HYDROXIDE-SIMETH 200-200-20 MG/5ML PO SUSP: 30.0000 mL | ORAL | Status: DC | PRN

## 2019-06-16 MED ORDER — TRAZODONE HCL 50 MG PO TABS: 50.0000 mg | ORAL_TABLET | Freq: Every day | ORAL | Status: DC

## 2019-06-16 MED ORDER — ACETAMINOPHEN 325 MG PO TABS
650.0000 mg | ORAL_TABLET | Freq: Four times a day (QID) | ORAL | Status: DC | PRN
  Administered 2019-06-17 – 2019-06-19 (×3): 650 mg via ORAL
  Filled 2019-06-16 (×3): qty 2

## 2019-06-16 MED ORDER — ZIPRASIDONE MESYLATE 20 MG IM SOLR: 20.0000 mg | Freq: Two times a day (BID) | INTRAMUSCULAR | Status: DC | PRN

## 2019-06-16 MED ORDER — LORAZEPAM 1 MG PO TABS: 1.0000 mg | ORAL_TABLET | ORAL | Status: DC | PRN

## 2019-06-16 MED ORDER — TRAZODONE HCL 50 MG PO TABS
50.0000 mg | ORAL_TABLET | Freq: Every day | ORAL | Status: DC
  Administered 2019-06-17: 50 mg via ORAL
  Filled 2019-06-16 (×5): qty 1

## 2019-06-16 NOTE — Progress Notes (Signed)
Patient ID: Bethany Decker, female   DOB: 2000/09/01, 19 y.o.   MRN: [id number]   [place] NOVEL CORONAVIRUS (COVID-19) DAILY CHECK-OFF SYMPTOMS - answer yes or no to each - every day NO YES  Have you had a fever in the past 24 hours?  . Fever (Temp > 37.80C / 100F) X   Have you had any of these symptoms in the past 24 hours? . New Cough .  Sore Throat  .  Shortness of Breath .  Difficulty Breathing .  Unexplained Body Aches   X   Have you had any one of these symptoms in the past 24 hours not related to allergies?   . Runny Nose .  Nasal Congestion .  Sneezing   X   If you have had runny nose, nasal congestion, sneezing in the past 24 hours, has it worsened?  X   EXPOSURES - check yes or no X   Have you traveled outside the state in the past 14 days?  X   Have you been in contact with someone with a confirmed diagnosis of COVID-19 or PUI in the past 14 days without wearing appropriate PPE?  X   Have you been living in the same home as a person with confirmed diagnosis of COVID-19 or a PUI (household contact)?    X   Have you been diagnosed with COVID-19?    X              What to do next: Answered NO to all: Answered YES to anything:   Proceed with unit schedule Follow the [facility] Inpatient Flowsheet.

## 2019-06-16 NOTE — ED Notes (Signed)
Attempted to call report and left message for nurse to call back after passing meds.

## 2019-06-16 NOTE — ED Notes (Signed)
Patient has had very uneventful night and day. Patient spends all day in bed. Only gets out of bed to use bathroom. Patient makes zero requests. Patient does not speak at all to anyone. Patient eats independently. Patient does use facility phone to speak with family members only when family members call first.

## 2019-06-16 NOTE — ED Notes (Signed)
Pt discharged safely with [facility].  All belongings were sent with patient.  Pt was in no distress at discharge.
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
## 2019-06-16 NOTE — Tx Team (Signed)
Initial Treatment Plan 06/16/2019 6:34 PM Bethany Decker TKZ:[id number]    PATIENT STRESSORS: Medication change or noncompliance Other: Overdose   PATIENT STRENGTHS: Supportive family/friends Work skills   PATIENT IDENTIFIED PROBLEMS: Agitation  Anger  Elective mutism                 DISCHARGE CRITERIA:  Improved stabilization in mood, thinking, and/or behavior Reduction of life-threatening or endangering symptoms to within safe limits Verbal commitment to aftercare and medication compliance  PRELIMINARY DISCHARGE PLAN: Outpatient therapy Return to previous living arrangement Return to previous work or school arrangements  PATIENT/FAMILY INVOLVEMENT: This treatment plan has been presented to and reviewed with the patient, Bethany Decker, and/or family member.  The patient and family have been given the opportunity to ask questions and make suggestions.  Tania Ade, RN 06/16/2019, 6:34 PM

## 2019-06-16 NOTE — ED Notes (Signed)
Patient accepted to [facility]. 300 hall. Can call report at 12:30/1.

## 2019-06-17 DIAGNOSIS — F332 Major depressive disorder, recurrent severe without psychotic features: Secondary | ICD-10-CM

## 2019-06-17 LAB — BASIC METABOLIC PANEL WITH GFR
Anion gap: 10 (ref 5–15)
BUN: 19 mg/dL (ref 6–20)
CO2: 23 mmol/L (ref 22–32)
Calcium: 9.2 mg/dL (ref 8.9–10.3)
Chloride: 105 mmol/L (ref 98–111)
Creatinine, Ser: 0.98 mg/dL (ref 0.44–1.00)
GFR calc Af Amer: >60 mL/min
GFR calc non Af Amer: >60 mL/min
Glucose, Bld: 100 mg/dL — ABNORMAL HIGH (ref 70–99)
Potassium: 4.3 mmol/L (ref 3.5–5.1)
Sodium: 138 mmol/L (ref 135–145)

## 2019-06-17 LAB — TSH: TSH: 0.323 u[IU]/mL — ABNORMAL LOW (ref 0.350–4.500)

## 2019-06-17 MED ORDER — POTASSIUM CHLORIDE CRYS ER 20 MEQ PO TBCR
40.0000 meq | EXTENDED_RELEASE_TABLET | Freq: Once | ORAL | Status: AC
  Administered 2019-06-17: 40 meq via ORAL
  Filled 2019-06-17 (×2): qty 2

## 2019-06-17 NOTE — Progress Notes (Signed)
   06/17/19 0035  Psych Admission Type (Psych Patients Only)  Admission Status Voluntary  Psychosocial Assessment  Patient Complaints None  Eye Contact Poor  Facial Expression Flat;Sad  Affect Sad;Sullen  Speech Slow;Elective mutism  Interaction Guarded;Poor  Motor Activity Other (Comment) (WNL)  Appearance/Hygiene In scrubs  Behavior Characteristics Other (Comment)  Mood Depressed  Thought Process  Coherency WDL  Content WDL  Delusions None reported or observed  Perception WDL  Hallucination None reported or observed  Judgment Impaired  Confusion None  Danger to Self  Current suicidal ideation? Denies  Danger to Others  Danger to Others None reported or observed  Attempted to woke up pt for assessment and med with no response. Respiration regular and unlabored. Pt observed turning around in bed called pt's name but with no response. Will continue to reassess.

## 2019-06-17 NOTE — Progress Notes (Signed)
Adult Psychoeducational Group Note  Date:  06/17/2019 Time:  1:18 AM  Group Topic/Focus:  Wrap-Up Group:   The focus of this group is to help patients review their daily goal of treatment and discuss progress on daily workbooks.  Participation Level:  Did Not Attend  Participation Quality:  Did Not Attend  Affect:  Did Not Attend  Cognitive:  Did Not Attend  Insight: None  Engagement in Group:  Did Not Attend  Modes of Intervention:  Did Not Attend  Additional Comments:  Pt did not attend evening wrap up group tonight.  Felipa Furnace 06/17/2019, 1:18 AM

## 2019-06-17 NOTE — Progress Notes (Addendum)
   06/17/19 1100  Psych Admission Type (Psych Patients Only)  Admission Status Voluntary  Psychosocial Assessment  Patient Complaints None  Eye Contact Brief  Facial Expression Flat  Affect Sad  Speech Soft;Slow  Interaction Cautious;Forwards little  Motor Activity Slow  Appearance/Hygiene In scrubs  Behavior Characteristics Appropriate to situation;Cooperative  Mood Depressed;Sad  Aggressive Behavior  Effect No apparent injury  Thought Process  Coherency WDL  Content WDL  Delusions None reported or observed  Perception WDL  Hallucination None reported or observed  Judgment Poor  Confusion None  Danger to Self  Current suicidal ideation? Denies  Danger to Others  Danger to Others None reported or observed
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
## 2019-06-17 NOTE — Progress Notes (Addendum)
Pt stated that her goal was to be positive.  Pt stated her relationship with her family is the same since being admitted. Pt stated she feel the same today. Pt rated her overall day a 6 out of 10. Pt stated her appetite is good today. Pt stated her sleep last night was good.   Pt deny auditory or visual hallucinations. Pt stated that she has no thoughts of harming herself or others. Pt stated she would alert staff if anything changes.

## 2019-06-17 NOTE — [facility] Group Notes (Signed)
[facility] Group Notes: (Clinical Social Work)   06/17/2019      Type of Therapy:  Group Therapy   Participation Level:  Did Not Attend - was invited both individually by [facility] and by overhead announcement, chose not to attend.   Ambrose Mantle, LCSW 06/17/2019, 3:19 PM
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
## 2019-06-18 LAB — T4, FREE: Free T4: 0.89 ng/dL (ref 0.61–1.12)

## 2019-06-18 MED ORDER — TRAZODONE HCL 100 MG PO TABS
100.0000 mg | ORAL_TABLET | Freq: Every evening | ORAL | Status: DC | PRN
  Administered 2019-06-18 – 2019-06-19 (×2): 100 mg via ORAL
  Filled 2019-06-18 (×2): qty 1

## 2019-06-18 MED ORDER — TRAZODONE HCL 100 MG PO TABS: 100.0000 mg | ORAL_TABLET | Freq: Every day | ORAL | Status: DC

## 2019-06-18 MED ORDER — HYDROXYZINE HCL 25 MG PO TABS: 25.0000 mg | ORAL_TABLET | Freq: Three times a day (TID) | ORAL | Status: DC | PRN

## 2019-06-18 NOTE — Tx Team (Signed)
Interdisciplinary Treatment and Diagnostic Plan Update  06/18/2019 Time of Session:  Bethany Decker MRN: [id number]  Principal Diagnosis: MDD (major depressive disorder)  Secondary Diagnoses: Principal Problem:   MDD (major depressive disorder)   Current Medications:  Current Facility-Administered Medications  Medication Dose Route Frequency Provider Last Rate Last Admin  . acetaminophen (TYLENOL) tablet 650 mg  650 mg Oral Q6H PRN Emmaline Kluver, FNP   650 mg at 06/17/19 2312  . alum & mag hydroxide-simeth (MAALOX/MYLANTA) 200-200-20 MG/5ML suspension 30 mL  30 mL Oral Q4H PRN Emmaline Kluver, FNP      . ziprasidone (GEODON) injection 20 mg  20 mg Intramuscular Q12H PRN Emmaline Kluver, FNP       And  . LORazepam (ATIVAN) tablet 1 mg  1 mg Oral PRN Emmaline Kluver, FNP      . magnesium hydroxide (MILK OF MAGNESIA) suspension 30 mL  30 mL Oral Daily PRN Emmaline Kluver, FNP      . traZODone (DESYREL) tablet 100 mg  100 mg Oral QHS PRN,MR X 1 Connye Burkitt, NP       PTA Medications: No medications prior to admission.    Patient Stressors: Medication change or noncompliance Other: Overdose  Patient Strengths: Supportive family/friends Work skills  Treatment Modalities: Medication Management, Group therapy, Case management,  1 to 1 session with clinician, Psychoeducation, Recreational therapy.   Physician Treatment Plan for Primary Diagnosis: MDD (major depressive disorder) Long Term Goal(s): Improvement in symptoms so as ready for discharge Improvement in symptoms so as ready for discharge   Short Term Goals: Ability to identify changes in lifestyle to reduce recurrence of condition will improve Ability to verbalize feelings will improve Ability to disclose and discuss suicidal ideas Ability to demonstrate self-control will improve Ability to identify and develop effective coping behaviors will improve Compliance with prescribed medications will improve Ability to identify triggers  associated with substance abuse/mental health issues will improve  Medication Management: Evaluate patient's response, side effects, and tolerance of medication regimen.  Therapeutic Interventions: 1 to 1 sessions, Unit Group sessions and Medication administration.  Evaluation of Outcomes: Not Met  Physician Treatment Plan for Secondary Diagnosis: Principal Problem:   MDD (major depressive disorder)  Long Term Goal(s): Improvement in symptoms so as ready for discharge Improvement in symptoms so as ready for discharge   Short Term Goals: Ability to identify changes in lifestyle to reduce recurrence of condition will improve Ability to verbalize feelings will improve Ability to disclose and discuss suicidal ideas Ability to demonstrate self-control will improve Ability to identify and develop effective coping behaviors will improve Compliance with prescribed medications will improve Ability to identify triggers associated with substance abuse/mental health issues will improve     Medication Management: Evaluate patient's response, side effects, and tolerance of medication regimen.  Therapeutic Interventions: 1 to 1 sessions, Unit Group sessions and Medication administration.  Evaluation of Outcomes: Not Met   RN Treatment Plan for Primary Diagnosis: MDD (major depressive disorder) Long Term Goal(s): Knowledge of disease and therapeutic regimen to maintain health will improve  Short Term Goals: Ability to verbalize frustration and anger appropriately will improve, Ability to participate in decision making will improve, Ability to verbalize feelings will improve, Ability to disclose and discuss suicidal ideas and Ability to identify and develop effective coping behaviors will improve  Medication Management: RN will administer medications as ordered by provider, will assess and evaluate patient's response and provide education to patient for prescribed medication. RN  will report any  adverse and/or side effects to prescribing provider.  Therapeutic Interventions: 1 on 1 counseling sessions, Psychoeducation, Medication administration, Evaluate responses to treatment, Monitor vital signs and CBGs as ordered, Perform/monitor CIWA, COWS, AIMS and Fall Risk screenings as ordered, Perform wound care treatments as ordered.  Evaluation of Outcomes: Not Met   LCSW Treatment Plan for Primary Diagnosis: MDD (major depressive disorder) Long Term Goal(s): Safe transition to appropriate next level of care at discharge, Engage patient in therapeutic group addressing interpersonal concerns.  Short Term Goals: Engage patient in aftercare planning with referrals and resources  Therapeutic Interventions: Assess for all discharge needs, 1 to 1 time with Social worker, Explore available resources and support systems, Assess for adequacy in community support network, Educate family and significant other(s) on suicide prevention, Complete Psychosocial Assessment, Interpersonal group therapy.  Evaluation of Outcomes: Not Met   Progress in Treatment: Attending groups: No. Participating in groups: No. Taking medication as prescribed: Yes. Toleration medication: Yes. Family/Significant other contact made: No, will contact:  if patient consents to collateral contacts Patient understands diagnosis: Yes. Discussing patient identified problems/goals with staff: Yes. Medical problems stabilized or resolved: Yes. Denies suicidal/homicidal ideation: Yes. Issues/concerns per patient self-inventory: No. Other:   New problem(s) identified: None   New Short Term/Long Term Goal(s): medication stabilization, elimination of SI thoughts, development of comprehensive mental wellness plan.    Patient Goals:    Discharge Plan or Barriers: Patient recently admitted. CSW will continue to follow and assess for appropriate referrals and possible discharge planning.    Reason for Continuation of  Hospitalization: Aggression Depression Hallucinations Medication stabilization  Estimated Length of Stay:  Attendees: Patient: 06/18/2019 8:53 AM  Physician: Dr. Johnn Hai, MD 06/18/2019 8:53 AM  Nursing: Rise Paganini.Raliegh Ip, RN 06/18/2019 8:53 AM  RN Care Manager: 06/18/2019 8:53 AM  Social Worker: Radonna Ricker, LCSW 06/18/2019 8:53 AM  Recreational Therapist:  06/18/2019 8:53 AM  Other:  06/18/2019 8:53 AM  Other:  06/18/2019 8:53 AM  Other: 06/18/2019 8:53 AM    Scribe for Treatment Team: Marylee Floras, [place] 06/18/2019 8:53 AM

## 2019-06-18 NOTE — Plan of Care (Signed)
Nurse discussed anxiety, depression and coping skills with patient.  

## 2019-06-18 NOTE — Progress Notes (Signed)
Recreation Therapy Notes  Date:  1.4.21 Time: 0930 Location: [address] Dayroom  Group Topic: Stress Management  Goal Area(s) Addresses:  Patient will identify positive stress management techniques. Patient will identify benefits of using stress management post d/c.  Behavioral Response:  Engaged  Intervention: Stress Management  Activity :  Meditation.  LRT played a meditation that focused on being resilient in the face of adversity.  Patients were to listen and follow along as meditation was played.  Education:  Stress Management, Discharge Planning.   Education Outcome: Acknowledges Education  Clinical Observations/Feedback: Pt listened and followed along as meditation played.    Caroll Rancher, LRT/CTRS    Caroll Rancher A 06/18/2019 10:46 AM

## 2019-06-18 NOTE — B[facility] Group Notes (Signed)
Pt. Was in the group for part of the time. She was quiet though attentive. When asked about her experience of loss she said she could not identify any. However when we discussed grief feelings she offered anger as one emotion which the group acknowledged.   Bethany Decker

## 2019-06-18 NOTE — Progress Notes (Signed)
Patient ID: Bethany Decker, female   DOB: May 01, 2001, 19 y.o.   MRN: [id number] DAR Note: Pt calm and cooperative earlier in shift, denied SI/HI/AVH, medicated with scheduled dose of Trazodone 50mg  for insomnia.  Pt is currently sleeping in bed with no signs of distress noted, Q15 minute checks for safety being maintained.

## 2019-06-18 NOTE — Progress Notes (Signed)
D:  Patient's self inventory sheet, patient has fair sleep, sleep medication not helpful.  Fair appetite, normal energy level, good concentration.  Denied depression, hopeless and anxiety.  Denied withdrawals.  Denied SI.  Denied physical problems.  Denied physical pain.  Goal is stay positive.  Plans to interact with others.  No discharge plans. A:  Medications administered per MD orders.  Emotional support and encouragement given patient. R:  Denied SI and HI, contracts for safety.  Denied A/V hallucinations.  Safety maintained with 15 minute checks.

## 2019-06-18 NOTE — BHH Group Notes (Signed)
Type of Therapy/Topic: Identifying Irrational Beliefs/Thoughts  Participation Level: Active  Description of Group: The purpose of this group is to assist patients in learning to identify irrational beliefs and thoughts that contribute to their negative emotions and experience positive emotions. Patients will be guided to discuss ways in which they have been effected by irrational thoughts and beliefs and how to transform those irrational beliefs into rational ones. Newly identified rational beliefs will be juxtaposed with experiences of positive emotions or situations, and patients will be challenged to use rational beliefs or thoughts to combat negative ones. Special emphasis will be placed on coping with irrational beliefs in conflict situations, and patients will process healthy conflict resolution skills.  Therapeutic Goals: 1. Patient will identify two irrational thoughts or beliefs  to reflect on in order to balance out those thoughts 2. Patient will label two or more irrational thoughts/beliefs that they find the most difficult to cope with 3. Patient will demonstrate positive conflict resolution skills through discussion and/or role plays that will assist in transforming irrational thoughts or beliefs into positive ones.  Summary of Patient Progress:  Due to the COVID-19 Pandemic, this group was supplemented with a worksheet and direct patient feedback.   Therapeutic Modalities: Cognitive Behavioral Therapy Feelings Identification Dialectical Behavioral Therapy 

## 2019-06-18 NOTE — Progress Notes (Signed)
Adult Psychoeducational Group Note  Date:  06/18/2019 Time:  9:21 PM  Group Topic/Focus:  Wrap-Up Group:   The focus of this group is to help patients review their daily goal of treatment and discuss progress on daily workbooks.  Participation Level:  Active  Participation Quality:  Appropriate  Affect:  Appropriate  Cognitive:  Appropriate  Insight: Appropriate  Engagement in Group:  Engaged  Modes of Intervention:  Discussion  Additional Comments:  Patient attended group and participated.   Bethany Decker 06/18/2019, 9:21 PM

## 2019-06-18 NOTE — Progress Notes (Addendum)
[facility] MD Progress Note  06/18/2019 9:27 AM Bethany Decker  MRN:  [id number] Subjective:  "I'm ok."  [name] found resting in bed. She presents with euthymic affect and reports stable mood. She states that her mother believes she is depressed because she stays in her room and sleeps during the day. She has had temp jobs working in warehouses recently but typically stays in her room when at home. She says that she stays in her room at home because she is "not a people person." She stays up overnight but then sleeps during the day. Recently she had begun taking a muscle relaxer which she was given to her by her boyfriend's family member. She states she had been taking 2 pills per day to help her sleep over the past month. She states she had taken 2 pills along with marijuana on the day of the overdose. She does not remember what happened next. Per notes from the ED, her boyfriend found her unresponsive and reported she had taken a half bottle of pills. The patient denies taking more than two pills and denies suicidal ideation or intent. She states she was "just trying to sleep." She denies recent depressed mood, anhedonia, energy or appetite problems but does admit to difficulty sleeping. She states she will sleep 3-5 hours but then needs to take the muscle relaxer to get back to sleep. We discussed risks of taking other people's prescription medications, including overdose, and patient states understanding. She is agreeable to trying medication for sleep. She reports difficulty sleeping last night. 6.5 hours of sleep recorded. She denies SI/HI/AVH. Labs reviewed. TSH 0.323. Patient denies history of thyroid issues.  From admission H&P: Patient is a 19 year old female with an unspecified past psychiatric history who presented to the [facility] emergency department on 06/12/2019 secondary to an intentional overdose. The patient was found on her bed by her boyfriend and was apparently  unresponsive. The notes from the emergency department stated she had done an overdose of clonazepam, but her drug screen on admission was negative for benzodiazepines. Today she stated that she had taken "too of my muscle relaxants". She stated that this was not an intentional overdose.  Principal Problem: MDD (major depressive disorder) Diagnosis: Principal Problem:   MDD (major depressive disorder)  Total Time spent with patient: 15 minutes  Past Psychiatric History: See admission H&P  Past Medical History:  Past Medical History:  Diagnosis Date  . Medical history non-contributory     Past Surgical History:  Procedure Laterality Date  . NO PAST SURGERIES     Family History: History reviewed. No pertinent family history. Family Psychiatric  History: See admission H&P Social History:  Social History   Substance and Sexual Activity  Alcohol Use Never     Social History   Substance and Sexual Activity  Drug Use Yes  . Types: Marijuana   Comment: undetermined amount    Social History   Socioeconomic History  . Marital status: Single    Spouse name: Not on file  . Number of children: Not on file  . Years of education: Not on file  . Highest education level: Not on file  Occupational History  . Not on file  Tobacco Use  . Smoking status: Never Smoker  . Smokeless tobacco: Never Used  Substance and Sexual Activity  . Alcohol use: Never  . Drug use: Yes    Types: Marijuana    Comment: undetermined amount  . Sexual activity: Yes  Birth control/protection: None  Other Topics Concern  . Not on file  Social History Narrative  . Not on file   Social Determinants of Health   Financial Resource Strain:   . Difficulty of Paying Living Expenses: Not on file  Food Insecurity:   . Worried About Programme researcher, broadcasting/film/video in the Last Year: Not on file  . Ran Out of Food in the Last Year: Not on file  Transportation Needs:   . Lack of Transportation (Medical): Not on file   . Lack of Transportation (Non-Medical): Not on file  Physical Activity:   . Days of Exercise per Week: Not on file  . Minutes of Exercise per Session: Not on file  Stress:   . Feeling of Stress : Not on file  Social Connections:   . Frequency of Communication with Friends and Family: Not on file  . Frequency of Social Gatherings with Friends and Family: Not on file  . Attends Religious Services: Not on file  . Active Member of Clubs or Organizations: Not on file  . Attends Banker Meetings: Not on file  . Marital Status: Not on file   Additional Social History:                         Sleep: Fair  Appetite:  Good  Current Medications: Current Facility-Administered Medications  Medication Dose Route Frequency Provider Last Rate Last Admin  . acetaminophen (TYLENOL) tablet 650 mg  650 mg Oral Q6H PRN Patrcia Dolly, FNP   650 mg at 06/17/19 2312  . alum & mag hydroxide-simeth (MAALOX/MYLANTA) 200-200-20 MG/5ML suspension 30 mL  30 mL Oral Q4H PRN Patrcia Dolly, FNP      . ziprasidone (GEODON) injection 20 mg  20 mg Intramuscular Q12H PRN Patrcia Dolly, FNP       And  . LORazepam (ATIVAN) tablet 1 mg  1 mg Oral PRN Patrcia Dolly, FNP      . magnesium hydroxide (MILK OF MAGNESIA) suspension 30 mL  30 mL Oral Daily PRN Patrcia Dolly, FNP      . traZODone (DESYREL) tablet 100 mg  100 mg Oral QHS PRN,MR X 1 Aldean Baker, NP        Lab Results:  Results for orders placed or performed during the hospital encounter of 06/16/19 (from the past 48 hour(s))  TSH     Status: Abnormal   Collection Time: 06/17/19  6:11 PM  Result Value Ref Range   TSH 0.323 (L) 0.350 - 4.500 uIU/mL    Comment: Performed by a 3rd Generation assay with a functional sensitivity of <=0.01 uIU/mL. Performed at [facility], [address], [place], [place] [address]   Basic metabolic panel     Status: Abnormal   Collection Time: 06/17/19  6:11 PM  Result Value Ref Range    Sodium 138 135 - 145 mmol/L   Potassium 4.3 3.5 - 5.1 mmol/L   Chloride 105 98 - 111 mmol/L   CO2 23 22 - 32 mmol/L   Glucose, Bld 100 (H) 70 - 99 mg/dL   BUN 19 6 - 20 mg/dL   Creatinine, Ser 7.82 0.44 - 1.00 mg/dL   Calcium 9.2 8.9 - 42.3 mg/dL   GFR calc non Af Amer >60 >60 mL/min   GFR calc Af Amer >60 >60 mL/min   Anion gap 10 5 - 15    Comment: Performed at [facility]  [place] [address] [place], [place] [address]    Blood Alcohol level:  Lab Results  Component Value Date   ETH <10 32/05/2481    Metabolic Disorder Labs: No results found for: HGBA1C, MPG No results found for: PROLACTIN No results found for: CHOL, TRIG, HDL, CHOLHDL, VLDL, LDLCALC  Physical Findings: AIMS: Facial and Oral Movements Muscles of Facial Expression: None, normal Lips and Perioral Area: None, normal Jaw: None, normal Tongue: None, normal,Extremity Movements Upper (arms, wrists, hands, fingers): None, normal Lower (legs, knees, ankles, toes): None, normal, Trunk Movements Neck, shoulders, hips: None, normal, Overall Severity Severity of abnormal movements (highest score from questions above): None, normal Incapacitation due to abnormal movements: None, normal Patient's awareness of abnormal movements (rate only patient's report): No Awareness, Dental Status Current problems with teeth and/or dentures?: No Does patient usually wear dentures?: No  CIWA:  CIWA-Ar Total: 0 COWS:     Musculoskeletal: Strength & Muscle Tone: within normal limits Gait & Station: normal Patient leans: N/A  Psychiatric Specialty Exam: Physical Exam  Nursing note and vitals reviewed. Constitutional: She is oriented to person, place, and time. She appears well-developed and well-nourished.  Respiratory: [place] normal.  Neurological: She is alert and oriented to person, place, and time.    Review of Systems  Constitutional: Negative.   Respiratory: Negative for cough and shortness of  breath.   Psychiatric/Behavioral: Positive for sleep disturbance. Negative for agitation, behavioral problems, confusion, dysphoric mood, hallucinations, self-injury and suicidal ideas. The patient is not nervous/anxious.     Blood pressure 112/85, pulse (!) 124, temperature 98.1 F (36.7 C), temperature source Oral, resp. rate 18, height 5\' 6"  (1.676 m), weight 59 kg, last menstrual period 03/19/2019, SpO2 100 %, unknown if currently breastfeeding.Body mass index is 20.98 kg/m.  General Appearance: Fairly Groomed  Eye Contact:  Good  Speech:  Normal Rate  Volume:  Normal  Mood:  Euthymic  Affect:  Congruent  Thought Process:  Coherent  Orientation:  Full (Time, Place, and Person)  Thought Content:  Logical  Suicidal Thoughts:  No  Homicidal Thoughts:  No  Memory:  Immediate;   Fair Recent;   Fair  Judgement:  Intact  Insight:  Lacking  Psychomotor Activity:  Normal  Concentration:  Concentration: Good and Attention Span: Good  Recall:  Good  Fund of Knowledge:  Fair  Language:  Good  Akathisia:  No  Handed:  Right  AIMS (if indicated):     Assets:  Communication Skills Desire for Improvement Housing Resilience  ADL's:  Intact  Cognition:  WNL  Sleep:  Number of Hours: 6.5     Treatment Plan Summary: Daily contact with patient to assess and evaluate symptoms and progress in treatment and Medication management   Continue inpatient hospitalization.  Check T3, T4.  Start trazodone 100 mg PO QHS for insomnia Start Vistaril 25 mg PO TID PRN anxiety  Patient will participate in the therapeutic group milieu.  Discharge disposition in progress.   Connye Burkitt, NP 06/18/2019, 9:27 AM

## 2019-06-18 NOTE — Progress Notes (Signed)
   06/18/19 2206  Psych Admission Type (Psych Patients Only)  Admission Status Involuntary  Psychosocial Assessment  Patient Complaints None  Eye Contact Fair  Facial Expression Flat  Affect Sad  Speech Logical/coherent  Interaction Assertive  Motor Activity Slow  Appearance/Hygiene Unremarkable  Behavior Characteristics Cooperative;Anxious  Mood Depressed  Aggressive Behavior  Effect No apparent injury  Thought Process  Coherency WDL  Content WDL  Delusions None reported or observed  Perception WDL  Hallucination None reported or observed  Judgment Poor  Confusion None  Danger to Self  Current suicidal ideation? Denies  Danger to Others  Danger to Others None reported or observed   Pt pleasant and cooperative. Denies SI, HI and AVH. Having pain in her throat which she rates 8/10. Given tylenol 650mg . Pt says her throat hurts when she swallows and feels like its closing. Pt assessed - no swelling noted, pt in NAD, swallows appropriately without any deviation. Pt contracts for safety.
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
## 2019-06-19 DIAGNOSIS — T50902A Poisoning by unspecified drugs, medicaments and biological substances, intentional self-harm, initial encounter: Secondary | ICD-10-CM

## 2019-06-19 LAB — T3: T3, Total: 82 ng/dL (ref 71–180)

## 2019-06-19 MED ORDER — MENTHOL 3 MG MT LOZG
1.0000 | LOZENGE | OROMUCOSAL | Status: DC | PRN
  Administered 2019-06-19: 3 mg via ORAL

## 2019-06-19 NOTE — Progress Notes (Signed)
Adult Psychoeducational Group Note  Date:  06/19/2019 Time:  8:57 PM  Group Topic/Focus:  Wrap-Up Group:   The focus of this group is to help patients review their daily goal of treatment and discuss progress on daily workbooks.  Participation Level:  Active  Participation Quality:  Appropriate  Affect:  Appropriate  Cognitive:  Appropriate  Insight: Appropriate  Engagement in Group:  Engaged  Modes of Intervention:  Discussion  Additional Comments:  Patient attended group and partipated.   Mykela Mewborn W Favio Moder 06/19/2019, 8:57 PM

## 2019-06-19 NOTE — Progress Notes (Signed)
Recreation Therapy Notes  Animal-Assisted Activity (AAA) Program Checklist/Progress Notes Patient Eligibility Criteria Checklist & Daily Group note for Rec Tx Intervention  Date: 1.5.21 Time: 1430 Location: [address]   AAA/T Program Assumption of Risk Form signed by Engineer, production or Parent Legal Guardian  YES   Patient is free of allergies or sever asthma  YES   Patient reports no fear of animals  YES   Patient reports no history of cruelty to animals  YES   Patient understands his/her participation is voluntary  YES   Patient washes hands before animal contact  YES   Patient washes hands after animal contact  YES   Behavioral Response: Engaged  Education: Charity fundraiser, Appropriate Animal Interaction   Education Outcome: Acknowledges understanding/In group clarification offered/Needs additional education.   Clinical Observations/Feedback:  Pt attended and participated in activity.    Caroll Rancher, LRT/CTRS         Caroll Rancher A 06/19/2019 3:39 PM

## 2019-06-19 NOTE — Progress Notes (Signed)
    06/19/19 1000  Psych Admission Type (Psych Patients Only)  Admission Status Involuntary  Psychosocial Assessment  Patient Complaints None  Eye Contact Fair  Facial Expression Flat  Affect Sad  Speech Logical/coherent  Interaction Assertive  Motor Activity Slow  Appearance/Hygiene Unremarkable  Behavior Characteristics Cooperative  Mood Anxious  Aggressive Behavior  Effect No apparent injury  Thought Process  Coherency WDL  Content WDL  Delusions None reported or observed  Perception WDL  Hallucination None reported or observed  Judgment Poor  Confusion None  Danger to Self  Current suicidal ideation? Denies  Danger to Others  Danger to Others None reported or observed
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
## 2019-06-20 DIAGNOSIS — T50902A Poisoning by unspecified drugs, medicaments and biological substances, intentional self-harm, initial encounter: Secondary | ICD-10-CM

## 2019-06-20 MED ORDER — TRAZODONE HCL 100 MG PO TABS: 100.0000 mg | ORAL_TABLET | Freq: Every day | ORAL | 0 refills | Status: DC

## 2019-06-20 MED ORDER — HYDROXYZINE HCL 25 MG PO TABS: 25.0000 mg | ORAL_TABLET | Freq: Three times a day (TID) | ORAL | 0 refills | Status: DC | PRN

## 2019-06-20 MED ORDER — MENTHOL 3 MG MT LOZG: 1.0000 | LOZENGE | OROMUCOSAL | 12 refills | Status: DC | PRN

## 2019-06-20 NOTE — Progress Notes (Signed)
  [facility] Adult Case Management Discharge Plan :  Will you be returning to the same living situation after discharge:  Yes,  home. At discharge, do you have transportation home?: Yes,  Lyft transportation at 1pm Do you have the ability to pay for your medications: Yes,  has Medicaid.  Release of information consent forms completed and in the chart.  Patient to Follow up at: Follow-up Information    [facility]. Call on 06/27/2019.   Why: Your hospital follow up appointment is Wednesday, 06/27/19 at 9:30am. This appointment will be held virtually over the phone. Expect a phone call from [phone]. For any additional questions, please call agency number.  Contact information: [address] [place] [address] [phone]           Next level of care provider has access to [facility] Link:no  Safety Planning and Suicide Prevention discussed: Yes,  with patient. Two attempts to reach mother.  Have you used any form of tobacco in the last 30 days? (Cigarettes, Smokeless Tobacco, Cigars, and/or Pipes): No  Has patient been referred to the Quitline?: N/A patient is not a smoker  Patient has been referred for addiction treatment: Yes  Darreld Mclean, LCSWA 06/20/2019, 11:36 AM

## 2019-06-20 NOTE — Progress Notes (Signed)
Recreation Therapy Notes  Date:  1.6.21 Time: 0930 Location: [address]  Group Topic: Stress Management  Goal Area(s) Addresses:  Patient will identify positive stress management techniques. Patient will identify benefits of using stress management post d/c.  Intervention: Stress Management  Activity :  Guided Imagery.  LRT read a script that encouraged patients to enjoy the sounds of waves while relaxing on the beach. Patients were to listen and follow along as meditation.  Education:  Stress Management, Discharge Planning.   Education Outcome: Acknowledges Education  Clinical Observations/Feedback: Pt did not attend activity.    Caroll Rancher, LRT/CTRS         Lillia Abed, Ho Parisi A 06/20/2019 11:08 AM

## 2019-06-20 NOTE — Progress Notes (Signed)
D:  Patient denied SI and HI, contracts for safety.  Denied A/V hallucinations.  Denied pain. A:  Medications administered per MD orders.  Emotional support and encouragement given patient. R:  Safety maintained with 15 minute checks.  

## 2019-06-20 NOTE — [facility] Suicide Risk Assessment (Signed)
[facility] INPATIENT:  Family/Significant Other Suicide Prevention Education  Suicide Prevention Education:  Contact Attempts: with mother, [name] [phone]) has been identified by the patient as the family member/significant other with whom the patient will be residing, and identified as the person(s) who will aid the patient in the event of a mental health crisis.  With written consent from the patient, two attempts were made to provide suicide prevention education, prior to and/or following the patient's discharge.  We were unsuccessful in providing suicide prevention education.  A suicide education pamphlet was given to the patient to share with family/significant other.  Date and time of first attempt: 06/20/2019 / 9:47am  Habib Kise E Rosaleen Mazer 06/20/2019, 9:47 AM
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
## 2019-06-20 NOTE — Progress Notes (Signed)
Discharge Note:  Patient discharged with lyft.  Patient denied SI and HI.  Denied A/V hallucinations.  Suicide prevention information given and discussed with patient who stated she understood and had no questions.  Patient stated she received all her belongings.  Patient stated she appreciated all assistance received from [facility] staff.  All required discharge information given to patient at discharge.

## 2019-06-20 NOTE — [facility] Suicide Risk Assessment (Signed)
[facility] INPATIENT:  Family/Significant Other Suicide Prevention Education  Suicide Prevention Education:  Contact Attempts: with mother, [name] [phone]) has been identified by the patient as the family member/significant other with whom the patient will be residing, and identified as the person(s) who will aid the patient in the event of a mental health crisis.  With written consent from the patient, two attempts were made to provide suicide prevention education, prior to and/or following the patient's discharge.  We were unsuccessful in providing suicide prevention education.  A suicide education pamphlet was given to the patient to share with family/significant other.  Date and time of first attempt: 06/20/2019 / 9:47am Date and time of second attempt: 06/20/2019 at 11:35am  Bethany Decker 06/20/2019, 11:36 AM

## 2019-06-20 NOTE — [facility] Suicide Risk Assessment (Addendum)
[facility] Discharge Suicide Risk Assessment   Principal Problem: Altered Mental Status on Admission Discharge Diagnoses: Principal Problem:   MDD (major depressive disorder)   Total Time spent with patient: 30 minutes  Musculoskeletal: Strength & Muscle Tone: within normal limits Gait & Station: normal Patient leans: N/A  Psychiatric Specialty Exam: Review of Systems denies headache, no ear ache, reports some persistent odynophagia but states it is " a little better today", denies loss of smell or taste, no chest pain, no shortness of breath, no cough, no vomiting, no fever or chills   Blood pressure (!) 85/57, pulse (!) 128, temperature 98 F (36.7 C), temperature source Oral, resp. rate 18, height 5\' 6"  (1.676 m), weight 59 kg, last menstrual period 03/19/2019, SpO2 100 %, unknown if currently breastfeeding.Body mass index is 20.98 kg/m.  Repeat vitals - 110/78, pulse 118, manual pulse 112 With female RN 05/19/2019) present I examined patient's throat - mild erythema , no exudate or plaques noted .  General Appearance: Well Groomed- appears calm, comfortable , in no acute distress   Eye Contact::  Good  Speech:  Normal Rate409  Volume:  Normal  Mood:  denies feeling depressed and describes mood as 8/10 with 10 being best   Affect:  reactive, appropriate  Thought Process:  Linear and Descriptions of Associations: Intact  Orientation:  Full (Time, Place, and Person)- currently presents alert and attentive and fully oriented x 3   Thought Content:  denies hallucinations, no delusions, does not appear internally preoccupied   Suicidal Thoughts:  No denies any suicidal or self injurious ideations, denies HI or violent ideations  Homicidal Thoughts:  No  Memory:  reports limited memory of event that led to admission.   Judgement:  Other:  improving   Insight:  Fair  Psychomotor Activity:  Normal- no psychomotor agitation or restlessness , presents calm, comfortable and in no acute distress    Concentration:  Good  Recall:  Good  Fund of Knowledge:Good  Language: Good  Akathisia:  Negative  Handed:  Right  AIMS (if indicated):     Assets:  Desire for Improvement Resilience  Sleep:  Number of Hours: 6.75  Cognition: WNL  ADL's:  Intact   Mental Status Per Nursing Assessment::   On Admission:  Self-harm behaviors, Self-harm thoughts, Suicidal ideation indicated by patient  Demographic Factors:  18, single, was living with boyfriend prior to admission- states she plans to go live with her sister for the time being  Loss Factors: Denies any significant contributing stressors  Historical Factors: Denies history of significant depressive episodes, denies history of suicidal attempts, denies history of prior psychiatric admissions, states has never been on psychiatric medications  Risk Reduction Factors:   Sense of responsibility to family, Living with another person, especially a relative and Positive coping skills or problem solving skills  Continued Clinical Symptoms:  Patient presents alert, attentive, calm, pleasant on approach. Currently she is fully oriented x 3 and does not appear confused. Speech normal, denies feeling depressed and presents euthymic, with an appropriate, reactive affect . No thought disorder, denies suicidal or self injurious ideations and presents future oriented, stating that she plans to go live with her sister for a period of time and that she plans to start job soon. Denies homicidal ideations, denies hallucinations, no delusions, not internally preoccupied . Behavior on unit calm and in good control, polite on approach. With her express consent I attempted to reach mother- no answer. She also provided consent for  me to speak with Unique ( her adult sister whom she plans to live with). Sister reported that patient appears to be doing well and at baseline . She states she is unsure what events where that led to admission. She is in agreement with  discharge and corroborates patient is going to go live with her after discharge. Patient reports limited memory of events leading to admission. Denies having been depressed leading up to event or having had any suicidal ideations. States she took some " muscle relaxer medication" from boyfriend in an attempt to sleep, not with any suicidal intent and " next thing I remember I was in the hospital". We reviewed importance of NOT taking any unknown medications and dangers associated with taking medications not prescribed to her .  Currently patient is not on any standing psychiatric medications . She has been prescribed Trazodone PRN for insomnia which may be contributing to orthostatic hypotension. Currently patient denies significant dizziness or lightheadedness . Will D/C Trazodone on discharge.   Cognitive Features That Contribute To Risk:  No gross cognitive deficits noted upon discharge. Is alert , attentive, and oriented x 3   Suicide Risk:  Mild:  Suicidal ideation of limited frequency, intensity, duration, and specificity.  There are no identifiable plans, no associated intent, mild dysphoria and related symptoms, good self-control (both objective and subjective assessment), few other risk factors, and identifiable protective factors, including available and accessible social support.  Follow-up Information    [facility]. Call on 06/27/2019.   Why: Your hospital follow up appointment is Wednesday, 06/27/19 at 9:30am. This appointment will be held virtually over the phone. Expect a phone call from [phone]. For any additional questions, please call agency number.  Contact information: [address] [place] [address] [phone]           Plan Of Care/Follow-up recommendations:  Activity:  as tolerated Diet:  regular  Tests:  NA Other:  See below  Patient is requesting discharge / expressing readiness for discharge. Based on above there are no ongoing grounds for  involuntary commitment. She  is leaving unit in good spirits . Plans to go live with sister. Follow up as above.  Recommended to seek medical care if odynophagia does not improve, worsens or if she develops other symptoms  Jenne Campus, MD 06/20/2019, 11:43 AM

## 2019-06-20 NOTE — Progress Notes (Signed)
   06/19/19 2140  Psych Admission Type (Psych Patients Only)  Admission Status Involuntary  Psychosocial Assessment  Patient Complaints None  Eye Contact Fair  Facial Expression Flat  Affect Sad  Speech Logical/coherent  Interaction Assertive  Motor Activity Slow  Appearance/Hygiene Unremarkable  Behavior Characteristics Cooperative  Mood Sad;Pleasant  Aggressive Behavior  Effect No apparent injury  Thought Process  Coherency WDL  Content WDL  Delusions None reported or observed  Perception WDL  Hallucination None reported or observed  Judgment Poor  Confusion None  Danger to Self  Current suicidal ideation? Denies  Danger to Others  Danger to Others None reported or observed   Pt pleasant and minimal. Denies SI, HI, and AVH. Pt endorses pain 7/10 in throat described as "sore." Pt given throat lozenges for the pain. Pt contracts for safety.

## 2019-07-20 ENCOUNTER — Inpatient Hospital Stay (HOSPITAL_COMMUNITY)
Admission: AD | Admit: 2019-07-20 | Discharge: 2019-07-20 | Disposition: A | Discharge status: Home / Self Care | Payer: Medicaid Other | Attending: Obstetrics & Gynecology | Admitting: Obstetrics & Gynecology

## 2019-07-20 ENCOUNTER — Other Ambulatory Visit: Payer: Self-pay

## 2019-07-20 DIAGNOSIS — N912 Amenorrhea, unspecified: Secondary | ICD-10-CM | POA: Diagnosis not present

## 2019-07-20 NOTE — MAU Provider Note (Signed)
[name] is a 19 y.o. G2P0010 who present to MAU today for pregnancy confirmation. She denies abdominal pain or vaginal bleeding.   BP 119/70 (BP Location: Right Arm)   Pulse 62   Temp 99 F (37.2 C) (Oral)   Resp 16   Ht 5\' 6"  (1.676 m)   Wt 59.5 kg   LMP 06/10/2019   SpO2 100%   BMI 21.18 kg/m  CONSTITUTIONAL: Well-developed, well-nourished female in no acute distress.  CARDIOVASCULAR: Normal heart rate noted RESPIRATORY: Effort and breath sounds normal GASTROINTESTINAL:Soft, no distention noted.  No tenderness, rebound or guarding.  SKIN: Skin is warm and dry. No rash noted. Not diaphoretic. No erythema. No pallor. PSYCHIATRIC: Normal mood and affect. Normal behavior. Normal judgment and thought content.  MDM Medical screening exam complete Patient does not endorse any symptoms concerning for ectopic pregnancy or pregnancy related complication today.   A:  Amenorrhea  P: Discharge home Patient advised that she can present as a walk-in to [facility] for a pregnancy test M-Th between 8am-4pm or Friday between 8am -11am Reasons to return to MAU reviewed  Patient may return to MAU as needed or if her condition were to change or worsen  Thursday DNP, CNM  07/20/19  12:39 PM

## 2019-07-20 NOTE — MAU Note (Signed)
Pt has done 3 preg tests, 2 were +, 1 was neg. Had missed a period, then only had one day of light pink  Bleeding. No pain, no bleeding.  Wanting to know if pregnant.

## 2019-10-12 ENCOUNTER — Encounter (HOSPITAL_COMMUNITY): Payer: Self-pay | Admitting: Obstetrics and Gynecology

## 2019-10-12 ENCOUNTER — Other Ambulatory Visit: Payer: Self-pay

## 2019-10-12 ENCOUNTER — Inpatient Hospital Stay (HOSPITAL_COMMUNITY)
Admission: AD | Admit: 2019-10-12 | Discharge: 2019-10-12 | Disposition: A | Discharge status: Home / Self Care | Payer: Medicaid Other | Attending: Obstetrics and Gynecology | Admitting: Obstetrics and Gynecology

## 2019-10-12 DIAGNOSIS — O26891 Other specified pregnancy related conditions, first trimester: Secondary | ICD-10-CM | POA: Diagnosis not present

## 2019-10-12 DIAGNOSIS — R1030 Lower abdominal pain, unspecified: Secondary | ICD-10-CM | POA: Diagnosis not present

## 2019-10-12 DIAGNOSIS — Z3A12 12 weeks gestation of pregnancy: Secondary | ICD-10-CM

## 2019-10-12 DIAGNOSIS — O26892 Other specified pregnancy related conditions, second trimester: Secondary | ICD-10-CM | POA: Diagnosis not present

## 2019-10-12 DIAGNOSIS — O0932 Supervision of pregnancy with insufficient antenatal care, second trimester: Secondary | ICD-10-CM | POA: Diagnosis not present

## 2019-10-12 DIAGNOSIS — Z3A21 21 weeks gestation of pregnancy: Secondary | ICD-10-CM

## 2019-10-12 DIAGNOSIS — R109 Unspecified abdominal pain: Secondary | ICD-10-CM | POA: Diagnosis present

## 2019-10-12 DIAGNOSIS — O26899 Other specified pregnancy related conditions, unspecified trimester: Secondary | ICD-10-CM

## 2019-10-12 LAB — URINALYSIS, ROUTINE W REFLEX MICROSCOPIC
Bilirubin Urine: NEGATIVE
Glucose, UA: NEGATIVE mg/dL
Hgb urine dipstick: NEGATIVE
Ketones, ur: NEGATIVE mg/dL
Nitrite: NEGATIVE
Protein, ur: NEGATIVE mg/dL
Specific Gravity, Urine: 1.012 (ref 1.005–1.030)
pH: 6 (ref 5.0–8.0)

## 2019-10-12 LAB — CBC
HCT: 32.2 % — ABNORMAL LOW (ref 36.0–46.0)
Hemoglobin: 11.2 g/dL — ABNORMAL LOW (ref 12.0–15.0)
MCH: 32.4 pg (ref 26.0–34.0)
MCHC: 34.8 g/dL (ref 30.0–36.0)
MCV: 93.1 fL (ref 80.0–100.0)
Platelets: 185 10*3/uL (ref 150–400)
RBC: 3.46 MIL/uL — ABNORMAL LOW (ref 3.87–5.11)
RDW: 11.7 % (ref 11.5–15.5)
WBC: 7 10*3/uL (ref 4.0–10.5)
nRBC: 0 % (ref 0.0–0.2)

## 2019-10-12 LAB — WET PREP, GENITAL
Sperm: NONE SEEN
Trich, Wet Prep: NONE SEEN
Yeast Wet Prep HPF POC: NONE SEEN

## 2019-10-12 LAB — HIV ANTIBODY (ROUTINE TESTING W REFLEX): HIV Screen 4th Generation wRfx: NONREACTIVE

## 2019-10-12 LAB — ABO/RH: ABO/RH(D): A POS

## 2019-10-12 LAB — HEPATITIS B SURFACE ANTIGEN: Hepatitis B Surface Ag: NONREACTIVE

## 2019-10-12 LAB — HEPATITIS C ANTIBODY: HCV Ab: NONREACTIVE

## 2019-10-12 NOTE — MAU Provider Note (Signed)
Chief Complaint: Abdominal Pain   First Provider Initiated Contact with Patient 10/12/19 1600     SUBJECTIVE HPI: Bethany Decker is a 19 y.o. G2P0010 at [redacted]w[redacted]d who presents to Maternity Admissions reporting intermittent lower abdominal cramping. Symptoms started over a week ago. Currently no pain. Denies n/v/d, fever, dysuria, vaginal bleeding, vaginal discharge, or LOF. LMP was 11/30 but states she thinks she's only [redacted] weeks pregnant. Had pregnancy tests in February, 2 were positive & 1 was negative. Has not been seen anywhere since then.   Past Medical History:  Diagnosis Date  . Medical history non-contributory    OB History  Gravida Para Term Preterm AB Living  2       1    SAB TAB Ectopic Multiple Live Births  1            # Outcome Date GA Lbr Len/2nd Weight Sex Delivery Anes PTL Lv  2 Current           1 SAB 01/27/19           Past Surgical History:  Procedure Laterality Date  . NO PAST SURGERIES     Social History   Socioeconomic History  . Marital status: Single    Spouse name: Not on file  . Number of children: Not on file  . Years of education: Not on file  . Highest education level: Not on file  Occupational History  . Not on file  Tobacco Use  . Smoking status: Never Smoker  . Smokeless tobacco: Never Used  Substance and Sexual Activity  . Alcohol use: Never  . Drug use: Yes    Types: Marijuana    Comment: undetermined amount  . Sexual activity: Yes    Birth control/protection: None  Other Topics Concern  . Not on file  Social History Narrative  . Not on file   Social Determinants of Health   Financial Resource Strain:   . Difficulty of Paying Living Expenses:   Food Insecurity:   . Worried About Programme researcher, broadcasting/film/video in the Last Year:   . Barista in the Last Year:   Transportation Needs:   . Freight forwarder (Medical):   Marland Kitchen Lack of Transportation (Non-Medical):   Physical Activity:   . Days of Exercise per Week:   . Minutes of  Exercise per Session:   Stress:   . Feeling of Stress :   Social Connections:   . Frequency of Communication with Friends and Family:   . Frequency of Social Gatherings with Friends and Family:   . Attends Religious Services:   . Active Member of Clubs or Organizations:   . Attends Banker Meetings:   Marland Kitchen Marital Status:   Intimate Partner Violence:   . Fear of Current or Ex-Partner:   . Emotionally Abused:   Marland Kitchen Physically Abused:   . Sexually Abused:    No family history on file. No current facility-administered medications on file prior to encounter.   Current Outpatient Medications on File Prior to Encounter  Medication Sig Dispense Refill  . Prenatal Vit-Fe Fumarate-FA (MULTIVITAMIN-PRENATAL) 27-0.8 MG TABS tablet Take 1 tablet by mouth daily at 12 noon.    . hydrOXYzine (ATARAX/VISTARIL) 25 MG tablet Take 1 tablet (25 mg total) by mouth 3 (three) times daily as needed for anxiety. 75 tablet 0  . menthol-cetylpyridinium (CEPACOL) 3 MG lozenge Take 1 lozenge (3 mg total) by mouth as needed for sore throat. (May buy from over  the counter): For sore throat 100 tablet 12   No Known Allergies  I have reviewed patient's Past Medical Hx, Surgical Hx, Family Hx, Social Hx, medications and allergies.   Review of Systems  Constitutional: Negative.   Gastrointestinal: Positive for abdominal pain (currently none). Negative for constipation, diarrhea, nausea and vomiting.  Genitourinary: Negative.     OBJECTIVE Patient Vitals for the past 24 hrs:  BP Temp Pulse Resp Height Weight  10/12/19 1757 115/62 -- -- -- -- --  10/12/19 1533 (!) 114/52 98.5 F (36.9 C) (!) 58 18 5\' 6"  (1.676 m) 64.4 kg   Constitutional: Well-developed, well-nourished female in no acute distress.  Cardiovascular: normal rate & rhythm, no murmur Respiratory: normal rate and effort. Lung sounds clear throughout GI: Abd soft, non-tender, Pos BS x 4. No guarding or rebound tenderness MS: Extremities  nontender, no edema, normal ROM Neurologic: Alert and oriented x 4.  GU:   NEFG, no blood. Cervix closed/thick/firm   LAB RESULTS Results for orders placed or performed during the hospital encounter of 10/12/19 (from the past 24 hour(s))  Urinalysis, Routine w reflex microscopic     Status: Abnormal   Collection Time: 10/12/19  3:30 PM  Result Value Ref Range   Color, Urine YELLOW YELLOW   APPearance HAZY (A) CLEAR   Specific Gravity, Urine 1.012 1.005 - 1.030   pH 6.0 5.0 - 8.0   Glucose, UA NEGATIVE NEGATIVE mg/dL   Hgb urine dipstick NEGATIVE NEGATIVE   Bilirubin Urine NEGATIVE NEGATIVE   Ketones, ur NEGATIVE NEGATIVE mg/dL   Protein, ur NEGATIVE NEGATIVE mg/dL   Nitrite NEGATIVE NEGATIVE   Leukocytes,Ua TRACE (A) NEGATIVE   RBC / HPF 0-5 0 - 5 RBC/hpf   WBC, UA 6-10 0 - 5 WBC/hpf   Bacteria, UA RARE (A) NONE SEEN   Squamous Epithelial / LPF 6-10 0 - 5   Mucus PRESENT   Wet prep, genital     Status: Abnormal   Collection Time: 10/12/19  4:45 PM   Specimen: PATH Cytology Cervicovaginal Ancillary Only  Result Value Ref Range   Yeast Wet Prep HPF POC NONE SEEN NONE SEEN   Trich, Wet Prep NONE SEEN NONE SEEN   Clue Cells Wet Prep HPF POC PRESENT (A) NONE SEEN   WBC, Wet Prep HPF POC FEW (A) NONE SEEN   Sperm NONE SEEN   CBC     Status: Abnormal   Collection Time: 10/12/19  5:07 PM  Result Value Ref Range   WBC 7.0 4.0 - 10.5 K/uL   RBC 3.46 (L) 3.87 - 5.11 MIL/uL   Hemoglobin 11.2 (L) 12.0 - 15.0 g/dL   HCT 32.2 (L) 36.0 - 46.0 %   MCV 93.1 80.0 - 100.0 fL   MCH 32.4 26.0 - 34.0 pg   MCHC 34.8 30.0 - 36.0 g/dL   RDW 11.7 11.5 - 15.5 %   Platelets 185 150 - 400 K/uL   nRBC 0.0 0.0 - 0.2 %  ABO/Rh     Status: None   Collection Time: 10/12/19  5:07 PM  Result Value Ref Range   ABO/RH(D)      A POS Performed at [facility], [address], [place],  [address]     IMAGING No results found.  MAU COURSE Orders Placed This Encounter  Procedures  .  Wet prep, genital  . Culture, OB Urine  . [place] MFM OB COMP + 14 WK  . Urinalysis, Routine w reflex microscopic  .  CBC  . RPR  . HIV Antibody (routine testing w rflx)  . Rubella screen  . Hepatitis B surface antigen  . Hepatitis C antibody  . ABO/Rh  . Discharge patient   No orders of the defined types were placed in this encounter.   MDM FHT present via doppler Patient asymptomatic today Cervix closed/thick  Wet prep + clue cells. Patient denies abnormal discharge & no concerning discharge on exam. Will defer tx for BV.   Patient states she thinks she is only [redacted] weeks pregnant even though LMP was in November & she had positive pregnancy tests in February. Bedside ultrasound performed to get better idea of gestational age.   Pt informed that the ultrasound is considered a limited OB ultrasound and is not intended to be a complete ultrasound exam.  Patient also informed that the ultrasound is not being completed with the intent of assessing for fetal or placental anomalies or any pelvic abnormalities.  Explained that the purpose of today's ultrasound is to assess for gestational age.  Patient acknowledges the purpose of the exam and the limitations of the study.  Active fetus. [redacted]w[redacted]d by femur length.   Based on informal BSUS, her LMP dating is likely correct. Will order outpatient anatomy ultrasound & send message to the office for her to start prenatal care.  Prenatal labs collected today in MAU  ASSESSMENT 1. Abdominal cramping affecting pregnancy   2. [redacted] weeks gestation of pregnancy   3. No prenatal care in current pregnancy in second trimester     PLAN Discharge home in stable condition. Outpatient anatomy ultrasound ordered - patient instructed to call office next week to get it scheduled Msg to [facility] for ob appointment Prenatal labs, gc/ct, & urine culture pending Discussed reasons to return to MAU  Follow-up Information    [facility] Follow  up.   Specialty: Maternal and Fetal Medicine Why: [phone] Contact information: [address] [place] [address]       [facility] Follow up.   Specialty: Obstetrics and Gynecology Why: the office will call you to schedule prenatal care Contact information: [address] [place] [address]         Allergies as of 10/12/2019   No Known Allergies     Medication List    TAKE these medications   hydrOXYzine 25 MG tablet Commonly known as: ATARAX/VISTARIL Take 1 tablet (25 mg total) by mouth 3 (three) times daily as needed for anxiety.   menthol-cetylpyridinium 3 MG lozenge Commonly known as: CEPACOL Take 1 lozenge (3 mg total) by mouth as needed for sore throat. (May buy from over the counter): For sore throat   multivitamin-prenatal 27-0.8 MG Tabs tablet Take 1 tablet by mouth daily at 12 noon.        Judeth Horn, NP 10/12/2019  5:59 PM

## 2019-10-12 NOTE — MAU Note (Signed)
Pt reports +HPT (went to urgent care 2 week to confirm). C/O lower abd pain and cramping x 1 week. Denies any vag bleeding or discharge at this time.

## 2019-10-12 NOTE — Discharge Instructions (Signed)

## 2019-10-13 LAB — CULTURE, OB URINE: Culture: NO GROWTH

## 2019-10-13 LAB — RUBELLA SCREEN: Rubella: 3.8 index (ref >0.99)

## 2019-10-13 LAB — RPR: RPR Ser Ql: NONREACTIVE

## 2019-10-15 LAB — GC/CHLAMYDIA PROBE AMP (~~LOC~~) NOT AT ARMC
Chlamydia: NEGATIVE
Comment: NEGATIVE
Comment: NORMAL
Neisseria Gonorrhea: NEGATIVE

## 2019-10-31 ENCOUNTER — Ambulatory Visit: Payer: Medicaid Other | Attending: Student

## 2019-10-31 ENCOUNTER — Telehealth: Payer: Self-pay | Admitting: General Practice

## 2019-10-31 ENCOUNTER — Other Ambulatory Visit: Payer: Self-pay

## 2019-10-31 DIAGNOSIS — O0932 Supervision of pregnancy with insufficient antenatal care, second trimester: Secondary | ICD-10-CM | POA: Diagnosis present

## 2019-10-31 DIAGNOSIS — Z3A20 20 weeks gestation of pregnancy: Secondary | ICD-10-CM | POA: Diagnosis not present

## 2019-10-31 DIAGNOSIS — Z363 Encounter for antenatal screening for malformations: Secondary | ICD-10-CM

## 2019-10-31 DIAGNOSIS — Z3A21 21 weeks gestation of pregnancy: Secondary | ICD-10-CM

## 2019-10-31 NOTE — Telephone Encounter (Signed)
   Lynzee Lindquist DOB: 03-06-2001 MRN: [id number]   RIDER WAIVER AND RELEASE OF LIABILITY  For purposes of improving physical access to our facilities, [facility] is pleased to partner with third parties to provide [facility] patients or other authorized individuals the option of convenient, on-demand ground transportation services (the Chiropractor") through use of the technology service that enables users to request on-demand ground transportation from independent third-party providers.  By opting to use and accept these [employer], I, the undersigned, hereby agree on behalf of myself, and on behalf of any minor child using the [employer] for whom I am the parent or legal guardian, as follows:  1. Science writer provided to me are provided by independent third-party transportation providers who are not [employer] or employees and who are unaffiliated with [employer]. 2. [facility] is neither a transportation carrier nor a common or public carrier. 3. [facility] has no control over the quality or safety of the transportation that occurs as a result of the [employer]. 4. [facility] cannot guarantee that any third-party transportation provider will complete any arranged transportation service. 5. [facility] makes no representation, warranty, or guarantee regarding the reliability, timeliness, quality, safety, suitability, or availability of any of the Transport Services or that they will be error free. 6. I fully understand that traveling by vehicle involves risks and dangers of serious bodily injury, including permanent disability, paralysis, and death. I agree, on behalf of myself and on behalf of any minor child using the Transport Services for whom I am the parent or legal guardian, that the entire risk arising out of my use of the [employer] remains solely with me, to the maximum extent permitted under applicable law. 7. The [employer] are provided "as is" and "as available." [facility] disclaims all representations and warranties, express, implied or statutory, not expressly set out in these terms, including the implied warranties of merchantability and fitness for a particular purpose. 8. I hereby waive and release [facility], its agents, employees, officers, directors, representatives, insurers, attorneys, assigns, successors, subsidiaries, and affiliates from any and all past, present, or future claims, demands, liabilities, actions, causes of action, or suits of any kind directly or indirectly arising from acceptance and use of the [employer]. 9. I further waive and release [facility] and its affiliates from all present and future liability and responsibility for any injury or death to persons or damages to property caused by or related to the use of the [employer]. 10. I have read this Waiver and Release of Liability, and I understand the terms used in it and their legal significance. This Waiver is freely and voluntarily given with the understanding that my right (as well as the right of any minor child for whom I am the parent or legal guardian using the [employer]) to legal recourse against [facility] in connection with the [employer] is knowingly surrendered in return for use of these services.   I attest that I read the consent document to Shelda Altes, gave Ms. Carano the opportunity to ask questions and answered the questions asked (if any). I affirm that Margarie Mcguirt then provided consent for she's participation in this program.     Farley Ly

## 2019-11-01 ENCOUNTER — Other Ambulatory Visit: Payer: Self-pay | Admitting: *Deleted

## 2019-11-01 DIAGNOSIS — Z362 Encounter for other antenatal screening follow-up: Secondary | ICD-10-CM

## 2019-11-07 ENCOUNTER — Other Ambulatory Visit (HOSPITAL_COMMUNITY)
Admission: RE | Admit: 2019-11-07 | Discharge: 2019-11-07 | Disposition: A | Discharge status: Home / Self Care | Payer: Medicaid Other | Source: Ambulatory Visit | Attending: Obstetrics and Gynecology | Admitting: Obstetrics and Gynecology

## 2019-11-07 ENCOUNTER — Encounter: Payer: Self-pay | Admitting: General Practice

## 2019-11-07 ENCOUNTER — Other Ambulatory Visit: Payer: Self-pay

## 2019-11-07 ENCOUNTER — Encounter: Payer: Self-pay | Admitting: Obstetrics and Gynecology

## 2019-11-07 ENCOUNTER — Ambulatory Visit (INDEPENDENT_AMBULATORY_CARE_PROVIDER_SITE_OTHER): Payer: Medicaid Other | Admitting: Obstetrics and Gynecology

## 2019-11-07 VITALS — BP 110/72 | HR 79 | Temp 98.2°F | Wt 138.8 lb

## 2019-11-07 DIAGNOSIS — Z3A21 21 weeks gestation of pregnancy: Secondary | ICD-10-CM | POA: Diagnosis not present

## 2019-11-07 DIAGNOSIS — Z3482 Encounter for supervision of other normal pregnancy, second trimester: Secondary | ICD-10-CM | POA: Diagnosis not present

## 2019-11-07 DIAGNOSIS — Z3480 Encounter for supervision of other normal pregnancy, unspecified trimester: Secondary | ICD-10-CM

## 2019-11-07 DIAGNOSIS — Z91410 Personal history of adult physical and sexual abuse: Secondary | ICD-10-CM

## 2019-11-07 MED ORDER — BLOOD PRESSURE MONITOR AUTOMAT DEVI: 1.0000 | Freq: Every day | 0 refills | Status: DC

## 2019-11-07 MED ORDER — GOJJI WEIGHT SCALE MISC: 1.0000 | Freq: Every day | 0 refills | Status: DC | PRN

## 2019-11-07 NOTE — Patient Instructions (Signed)
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
## 2019-11-08 ENCOUNTER — Telehealth: Payer: Self-pay | Admitting: *Deleted

## 2019-11-08 DIAGNOSIS — B9689 Other specified bacterial agents as the cause of diseases classified elsewhere: Secondary | ICD-10-CM

## 2019-11-08 LAB — CERVICOVAGINAL ANCILLARY ONLY
Bacterial Vaginitis (gardnerella): POSITIVE — AB
Candida Glabrata: NEGATIVE
Candida Vaginitis: NEGATIVE
Chlamydia: NEGATIVE
Comment: NEGATIVE
Comment: NEGATIVE
Comment: NEGATIVE
Comment: NEGATIVE
Comment: NEGATIVE
Comment: NORMAL
Neisseria Gonorrhea: NEGATIVE
Trichomonas: NEGATIVE

## 2019-11-08 MED ORDER — METRONIDAZOLE 500 MG PO TABS: 500.0000 mg | ORAL_TABLET | Freq: Two times a day (BID) | ORAL | 0 refills | Status: DC

## 2019-11-08 NOTE — Telephone Encounter (Signed)
-----   Message from Raelyn Mora, [place] sent at 11/08/2019  3:41 PM EDT ----- Please treat for BV

## 2019-11-14 ENCOUNTER — Encounter: Payer: Self-pay | Admitting: General Practice

## 2019-11-19 ENCOUNTER — Encounter: Payer: Self-pay | Admitting: General Practice

## 2019-11-20 ENCOUNTER — Encounter: Payer: Self-pay | Admitting: General Practice

## 2019-11-28 ENCOUNTER — Other Ambulatory Visit: Payer: Self-pay

## 2019-11-28 ENCOUNTER — Ambulatory Visit: Payer: Medicaid Other | Attending: Obstetrics and Gynecology

## 2019-11-28 DIAGNOSIS — Z362 Encounter for other antenatal screening follow-up: Secondary | ICD-10-CM | POA: Insufficient documentation

## 2019-11-28 DIAGNOSIS — Z3A24 24 weeks gestation of pregnancy: Secondary | ICD-10-CM | POA: Diagnosis not present

## 2019-12-06 ENCOUNTER — Telehealth: Payer: Medicaid Other | Admitting: Obstetrics and Gynecology

## 2019-12-06 ENCOUNTER — Telehealth: Payer: Self-pay

## 2019-12-06 NOTE — Telephone Encounter (Signed)
Unable to reach pt for ROB virtual visit Unable to leave message; no voicemail available

## 2019-12-07 ENCOUNTER — Telehealth (INDEPENDENT_AMBULATORY_CARE_PROVIDER_SITE_OTHER): Payer: Medicaid Other

## 2019-12-07 DIAGNOSIS — Z3480 Encounter for supervision of other normal pregnancy, unspecified trimester: Secondary | ICD-10-CM

## 2019-12-07 DIAGNOSIS — Z3482 Encounter for supervision of other normal pregnancy, second trimester: Secondary | ICD-10-CM

## 2019-12-07 DIAGNOSIS — Z3A25 25 weeks gestation of pregnancy: Secondary | ICD-10-CM

## 2019-12-07 NOTE — Patient Instructions (Signed)
Glucose Tolerance Test During Pregnancy Why am I having this test? The glucose tolerance test (GTT) is done to check how your body processes sugar (glucose). This is one of several tests used to diagnose diabetes that develops during pregnancy (gestational diabetes mellitus). Gestational diabetes is a temporary form of diabetes that some women develop during pregnancy. It usually occurs during the second trimester of pregnancy and goes away after delivery. Testing (screening) for gestational diabetes usually occurs between 24 and 28 weeks of pregnancy. You may have the GTT test after having a 1-hour glucose screening test if the results from that test indicate that you may have gestational diabetes. You may also have this test if:  You have a history of gestational diabetes.  You have a history of giving birth to very large babies or have experienced repeated fetal loss (stillbirth).  You have signs and symptoms of diabetes, such as: ? Changes in your vision. ? Tingling or numbness in your hands or feet. ? Changes in hunger, thirst, and urination that are not otherwise explained by your pregnancy. What is being tested? This test measures the amount of glucose in your blood at different times during a period of 3 hours. This indicates how well your body is able to process glucose. What kind of sample is taken?  Blood samples are required for this test. They are usually collected by inserting a needle into a blood vessel. How do I prepare for this test?  For 3 days before your test, eat normally. Have plenty of carbohydrate-rich foods.  Follow instructions from your health care provider about: ? Eating or drinking restrictions on the day of the test. You may be asked to not eat or drink anything other than water (fast) starting 8-10 hours before the test. ? Changing or stopping your regular medicines. Some medicines may interfere with this test. Tell a health care provider about:  All  medicines you are taking, including vitamins, herbs, eye drops, creams, and over-the-counter medicines.  Any blood disorders you have.  Any surgeries you have had.  Any medical conditions you have. What happens during the test? First, your blood glucose will be measured. This is referred to as your fasting blood glucose, since you fasted before the test. Then, you will drink a glucose solution that contains a certain amount of glucose. Your blood glucose will be measured again 1, 2, and 3 hours after drinking the solution. This test takes about 3 hours to complete. You will need to stay at the testing location during this time. During the testing period:  Do not eat or drink anything other than the glucose solution.  Do not exercise.  Do not use any products that contain nicotine or tobacco, such as cigarettes and e-cigarettes. If you need help stopping, ask your health care provider. The testing procedure may vary among health care providers and hospitals. How are the results reported? Your results will be reported as milligrams of glucose per deciliter of blood (mg/dL) or millimoles per liter (mmol/L). Your health care provider will compare your results to normal ranges that were established after testing a large group of people (reference ranges). Reference ranges may vary among labs and hospitals. For this test, common reference ranges are:  Fasting: less than 95-105 mg/dL (5.3-5.8 mmol/L).  1 hour after drinking glucose: less than 180-190 mg/dL (10.0-10.5 mmol/L).  2 hours after drinking glucose: less than 155-165 mg/dL (8.6-9.2 mmol/L).  3 hours after drinking glucose: 140-145 mg/dL (7.8-8.1 mmol/L). What do the   results mean? Results within reference ranges are considered normal, meaning that your glucose levels are well-controlled. If two or more of your blood glucose levels are high, you may be diagnosed with gestational diabetes. If only one level is high, your health care  provider may suggest repeat testing or other tests to confirm a diagnosis. Talk with your health care provider about what your results mean. Questions to ask your health care provider Ask your health care provider, or the department that is doing the test:  When will my results be ready?  How will I get my results?  What are my treatment options?  What other tests do I need?  What are my next steps? Summary  The glucose tolerance test (GTT) is one of several tests used to diagnose diabetes that develops during pregnancy (gestational diabetes mellitus). Gestational diabetes is a temporary form of diabetes that some women develop during pregnancy.  You may have the GTT test after having a 1-hour glucose screening test if the results from that test indicate that you may have gestational diabetes. You may also have this test if you have any symptoms or risk factors for gestational diabetes.  Talk with your health care provider about what your results mean. This information is not intended to replace advice given to you by your health care provider. Make sure you discuss any questions you have with your health care provider. Document Revised: 09/21/2018 Document Reviewed: 01/10/2017 Elsevier Patient Education  2020 [employer] Inc.  

## 2019-12-07 NOTE — Progress Notes (Signed)
OBSTETRICS PRENATAL VIRTUAL VISIT ENCOUNTER NOTE  Provider location: [facility]   I connected with Bethany Decker on 12/07/19 at 11:10 AM EDT by MyChart Video Encounter at home and verified that I am speaking with the correct person using two identifiers.   I discussed the limitations, risks, security and privacy concerns of performing an evaluation and management service virtually and the availability of in person appointments. I also discussed with the patient that there may be a patient responsible charge related to this service. The patient expressed understanding and agreed to proceed. Subjective:  Bethany Decker is a 19 y.o. G2P0010 at [redacted]w[redacted]d being seen today for ongoing prenatal care.  She is currently monitored for the following issues for this low-risk pregnancy and has Foster care (status); Clonazepam overdose of undetermined intent; Suicidal ideation; MDD (major depressive disorder); Intentional drug overdose (HCC); and Supervision of other normal pregnancy, antepartum on their problem list.  Patient reports intermittent pelvic pain. Patient reports fetal movement, but feels "it is not as much as it used to be."  Patient states pelvic pain is present when "she is down there." Patient also reports that she does not have bp cuff as "it was left in [place] and was probably thrown out."  Contractions: Irritability. Vag. Bleeding: None.  Movement: Present. Patient denies vaginal concerns including discharge, bleeding, or any leaking of fluid.   The following portions of the patient's history were reviewed and updated as appropriate: allergies, current medications, past family history, past medical history, past social history, past surgical history and problem list.   Objective:  There were no vitals filed for this visit.  Fetal Status:     Movement: Present     General:  Alert, oriented and cooperative. Patient is in no acute distress.  Respiratory:  Normal respiratory effort, no problems with respiration noted  Mental Status: Normal mood and affect. Normal behavior. Normal judgment and thought content.  Rest of physical exam deferred due to type of encounter  Imaging: [place] MFM OB FOLLOW UP  Result Date: 11/28/2019 ----------------------------------------------------------------------  OBSTETRICS REPORT                       (Signed Final 11/28/2019 04:32 pm) ---------------------------------------------------------------------- Patient Info  ID #:       [id number]                          D.O.B.:  01-Feb-2001 (19 yrs)  Name:       Bethany Decker               Visit Date: 11/28/2019 03:27 pm ---------------------------------------------------------------------- Performed By  Attending:        Ma Rings MD         Ref. Address:     [address]  Performed By:     Lenise Arena        [facility]  Referred By:      Judeth Horn                    CNM ---------------------------------------------------------------------- Orders  #  Description                           Code        Ordered By  1  [place] MFM OB FOLLOW UP                   [id number]    RAVI [facility] ----------------------------------------------------------------------  #  Order #                     Accession #                Episode #  1  [id number]                   [id number]                 [id number] ---------------------------------------------------------------------- Indications  Encounter for other antenatal screening        Z36.2  follow-up  [redacted] weeks gestation of pregnancy                Z3A.24 ---------------------------------------------------------------------- Fetal Evaluation  Num Of Fetuses:         1  Fetal Heart Rate(bpm):  150  Cardiac Activity:       Observed  Presentation:           Oblique  Placenta:                Anterior  P. Cord Insertion:      Previously Visualized  Amniotic Fluid  AFI FV:      Within normal limits                              Largest Pocket(cm)                              4.5 ---------------------------------------------------------------------- Biometry  BPD:      55.3  mm     G. Age:  22w 6d        3.1  %    CI:        70.37   %    70 - 86                                                          FL/HC:      21.7   %    18.7 - 20.3  HC:      210.2  mm     G. Age:  23w 1d          2  %    HC/AC:      1.05        1.04 - 1.22  AC:      200.4  mm     G. Age:  24w 5d         44  %    FL/BPD:     82.5   %  71 - 87  FL:       45.6  mm     G. Age:  25w 1d         54  %    FL/AC:      22.8   %    20 - 24  LV:        3.6  mm  Est. FW:     711  gm      1 lb 9 oz     40  % ---------------------------------------------------------------------- OB History  Gravidity:    2         Term:   0        Prem:   0        SAB:   1  Living:       0 ---------------------------------------------------------------------- Gestational Age  LMP:           28w 2d        Date:  05/14/19                 EDD:   02/18/20  U/S Today:     24w 0d                                        EDD:   03/19/20  Best:          24w 4d     Det. By:  U/S  (10/31/19)          EDD:   03/15/20 ---------------------------------------------------------------------- Anatomy  Cranium:               Appears normal         Aortic Arch:            Appears normal  Cavum:                 Appears normal         Ductal Arch:            Appears normal  Ventricles:            Appears normal         Diaphragm:              Appears normal  Choroid Plexus:        Previously seen        Stomach:                Appears normal, left                                                                        sided  Cerebellum:            Previously seen        Abdomen:                Appears normal  Posterior Fossa:       Previously seen        Abdominal Wall:         Previously  seen  Nuchal Fold:           Previously seen  Cord Vessels:           Previously seen  Face:                  Appears normal         Kidneys:                Appear normal                         (orbits and profile)  Lips:                  Appears normal         Bladder:                Appears normal  Thoracic:              Appears normal         Spine:                  Limited views                                                                        previously seen  Heart:                 Appears normal         Upper Extremities:      Previously seen                         (4CH, axis, and                         situs)  RVOT:                  Appears normal         Lower Extremities:      Previously seen  LVOT:                  Appears normal  Other:  Technically difficult due to fetal position. ---------------------------------------------------------------------- Cervix Uterus Adnexa  Cervix  Length:           3.38  cm.  Not visualized (advanced GA >24wks) Normal appearance by  transabdominal scan. ---------------------------------------------------------------------- Comments  This patient was seen for a follow up exam as the views of  the fetal anatomy were unable to be fully visualized during  her last exam.  She denies any problems since her last exam.  She was informed that the fetal growth and amniotic fluid  level appears appropriate for her gestational age.  The views of the fetal anatomy were visualized today.  There  were no obvious anomalies noted.  The limitations of ultrasound in the detection of all anomalies  was discussed.  Follow-up as indicated. ----------------------------------------------------------------------                   Johnell Comings, MD Electronically Signed Final Report   11/28/2019 04:32 pm ----------------------------------------------------------------------   Assessment and Plan:  Pregnancy: G2P0010 at [redacted]w[redacted]d 1. Supervision of other normal pregnancy,  antepartum -Reviewed [place] from previous visit.  Informed that no further [place] indicated. -  Discussed obtaining another BP monitor if possible.  Darcella Cheshire, RN instructed to send script.  -Patient warned that medicaid may not pay for 2nd monitor.  -Reassured that fetal movement varies throughout pregnancy and times of day.  Encouraged to monitor. -Reviewed Glucola appt preparation including fasting the night before and morning of.   -Discussed anticipated office time of 2.5-3 hours.  -Reviewed blood draw procedures and labs which also include check of iron level.  -Discussed how results of GTT are handled including diabetic education and BS testing for abnormal results and routine care for normal results.   Preterm labor symptoms and general obstetric precautions including but not limited to vaginal bleeding, contractions, leaking of fluid and fetal movement were reviewed in detail with the patient. I discussed the assessment and treatment plan with the patient. The patient was provided an opportunity to ask questions and all were answered. The patient agreed with the plan and demonstrated an understanding of the instructions. The patient was advised to call back or seek an in-person office evaluation/go to [facility] at [facility] for any urgent or concerning symptoms. Please refer to After Visit Summary for other counseling recommendations.   I provided 7 minutes of face-to-face time during this encounter.  No follow-ups on file.  Future Appointments  Date Time Provider Department Center  12/20/2019  8:30 AM Raelyn Mora, CNM [facility] None    Cherre Robins, CNM [facility] [employer]

## 2019-12-20 ENCOUNTER — Encounter: Payer: Self-pay | Admitting: General Practice

## 2019-12-20 ENCOUNTER — Encounter: Payer: Medicaid Other | Admitting: Obstetrics and Gynecology

## 2019-12-25 ENCOUNTER — Other Ambulatory Visit: Payer: Self-pay

## 2019-12-25 DIAGNOSIS — Z3480 Encounter for supervision of other normal pregnancy, unspecified trimester: Secondary | ICD-10-CM

## 2020-01-03 ENCOUNTER — Other Ambulatory Visit: Payer: Self-pay

## 2020-01-03 ENCOUNTER — Ambulatory Visit (INDEPENDENT_AMBULATORY_CARE_PROVIDER_SITE_OTHER): Payer: Medicaid Other | Admitting: Obstetrics and Gynecology

## 2020-01-03 ENCOUNTER — Encounter: Payer: Self-pay | Admitting: Obstetrics and Gynecology

## 2020-01-03 VITALS — BP 121/76 | HR 75 | Temp 98.2°F | Wt 154.8 lb

## 2020-01-03 DIAGNOSIS — Z3A29 29 weeks gestation of pregnancy: Secondary | ICD-10-CM

## 2020-01-03 DIAGNOSIS — Z3480 Encounter for supervision of other normal pregnancy, unspecified trimester: Secondary | ICD-10-CM

## 2020-01-03 DIAGNOSIS — Z3483 Encounter for supervision of other normal pregnancy, third trimester: Secondary | ICD-10-CM

## 2020-01-03 MED ORDER — PRENATAL 27-0.8 MG PO TABS: 1.0000 | ORAL_TABLET | Freq: Every day | ORAL | 12 refills | Status: DC

## 2020-01-03 NOTE — Progress Notes (Signed)
   LOW-RISK PREGNANCY OFFICE VISIT Patient name: Bethany Decker MRN [id number]  Date of birth: 07-Feb-2001 Chief Complaint:   Routine Prenatal Visit  History of Present Illness:   Bethany Decker is a 19 y.o. G1P0010 female at [redacted]w[redacted]d with an Estimated Date of Delivery: 03/15/20 being seen today for ongoing management of a low-risk pregnancy.  Today she reports no complaints. Contractions: Not present. Vag. Bleeding: None.  Movement: Present. denies leaking of fluid. Review of Systems:   Pertinent items are noted in HPI Denies abnormal vaginal discharge w/ itching/odor/irritation, headaches, visual changes, shortness of breath, chest pain, abdominal pain, severe nausea/vomiting, or problems with urination or bowel movements unless otherwise stated above. Pertinent History Reviewed:  Reviewed past medical,surgical, social, obstetrical and family history.  Reviewed problem list, medications and allergies. Physical Assessment:   Vitals:   01/03/20 0829  BP: 121/76  Pulse: 75  Temp: 98.2 F (36.8 C)  Weight: 154 lb 12.8 oz (70.2 kg)  Body mass index is 24.99 kg/m.        Physical Examination:   General appearance: Well appearing, and in no distress  Mental status: Alert, oriented to person, place, and time  Skin: Warm & dry  Cardiovascular: Normal heart rate noted  Respiratory: Normal respiratory effort, no distress  Abdomen: Soft, gravid, nontender  Pelvic: Cervical exam deferred         Extremities: Edema: None  Fetal Status: Fetal Heart Rate (bpm): 147 Fundal Height: 27 cm Movement: Present Presentation: Undeterminable  No results found for this or any previous visit (from the past 24 hour(s)).  Assessment & Plan:  1) Low-risk pregnancy G2P0010 at [redacted]w[redacted]d with an Estimated Date of Delivery: 03/15/20   2) 1. Supervision of other normal pregnancy, antepartum - Prenatal Vit-Fe Fumarate-FA (MULTIVITAMIN-PRENATAL) 27-0.8 MG TABS tablet; Take 1 tablet by mouth daily at 12 noon.   Dispense: 30 tablet; Refill: 12 - Glucose Tolerance, 2 Hours w/1 Hour - HIV Antibody (routine testing w rflx) - RPR - CBC - Anticipatory guidance for visits being every 2 weeks until 36 wks   Meds:  Meds ordered this encounter  Medications  . Prenatal Vit-Fe Fumarate-FA (MULTIVITAMIN-PRENATAL) 27-0.8 MG TABS tablet    Sig: Take 1 tablet by mouth daily at 12 noon.    Dispense:  30 tablet    Refill:  12   Labs/procedures today: 2 hr GTT and 3rd trimester labs  Plan:  Continue routine obstetrical care   Reviewed: Preterm labor symptoms and general obstetric precautions including but not limited to vaginal bleeding, contractions, leaking of fluid and fetal movement were reviewed in detail with the patient.  All questions were answered. Did not pick up home bp cuff. Check bp weekly, let us know if >140/90.   Follow-up: Return in about 4 weeks (around 01/31/2020) for Return OB visit.  Orders Placed This Encounter  Procedures  . Glucose Tolerance, 2 Hours w/1 Hour  . HIV Antibody (routine testing w rflx)  . RPR  . CBC   Raelyn Mora MSN, [place] 01/03/2020

## 2020-01-03 NOTE — Patient Instructions (Addendum)
Steps to Quit Smoking Smoking tobacco is the leading cause of preventable death. It can affect almost every organ in the body. Smoking puts you and those around you at risk for developing many serious chronic diseases. Quitting smoking can be difficult, but it is one of the best things that you can do for your health. It is never too late to quit. How do I get ready to quit? When you decide to quit smoking, create a plan to help you succeed. Before you quit:  Pick a date to quit. Set a date within the next 2 weeks to give you time to prepare.  Write down the reasons why you are quitting. Keep this list in places where you will see it often.  Tell your family, friends, and co-workers that you are quitting. Support from your loved ones can make quitting easier.  Talk with your health care provider about your options for quitting smoking.  Find out what treatment options are covered by your health insurance.  Identify people, places, things, and activities that make you want to smoke (triggers). Avoid them. What first steps can I take to quit smoking?  Throw away all cigarettes at home, at work, and in your car.  Throw away smoking accessories, such as Scientist, research (medical).  Clean your car. Make sure to empty the ashtray.  Clean your home, including curtains and carpets. What strategies can I use to quit smoking? Talk with your health care provider about combining strategies, such as taking medicines while you are also receiving in-person counseling. Using these two strategies together makes you more likely to succeed in quitting than if you used either strategy on its own.  If you are pregnant or breastfeeding, talk with your health care provider about finding counseling or other support strategies to quit smoking. Do not take medicine to help you quit smoking unless your health care provider tells you to do so. To quit smoking: Quit right away  Quit smoking completely, instead of  gradually reducing how much you smoke over a period of time. Research shows that stopping smoking right away is more successful than gradually quitting.  Attend in-person counseling to help you build problem-solving skills. You are more likely to succeed in quitting if you attend counseling sessions regularly. Even short sessions of 10 minutes can be effective. Take medicine You may take medicines to help you quit smoking. Some medicines require a prescription and some you can purchase over-the-counter. Medicines may have nicotine in them to replace the nicotine in cigarettes. Medicines may:  Help to stop cravings.  Help to relieve withdrawal symptoms. Your health care provider may recommend:  Nicotine patches, gum, or lozenges.  Nicotine inhalers or sprays.  Non-nicotine medicine that is taken by mouth. Find resources Find resources and support systems that can help you to quit smoking and remain smoke-free after you quit. These resources are most helpful when you use them often. They include:  Online chats with a Social worker.  Telephone quitlines.  Printed Furniture conservator/restorer.  Support groups or group counseling.  Text messaging programs.  Mobile phone apps or applications. Use apps that can help you stick to your quit plan by providing reminders, tips, and encouragement. There are many free apps for mobile devices as well as websites. Examples include Quit Guide from the [employer] and smokefree.gov What things can I do to make it easier to quit?   Reach out to your family and friends for support and encouragement. Call telephone quitlines (1-800-QUIT-NOW), reach  out to support groups, or work with a Veterinary surgeoncounselor for support.  Ask people who smoke to avoid smoking around you.  Avoid places that trigger you to smoke, such as bars, parties, or smoke-break areas at work.  Spend time with people who do not smoke.  Lessen the stress in your life. Stress can be a smoking trigger for some  people. To lessen stress, try: ? Exercising regularly. ? Doing deep-breathing exercises. ? Doing yoga. ? Meditating. ? Performing a body scan. This involves closing your eyes, scanning your body from head to toe, and noticing which parts of your body are particularly tense. Try to relax the muscles in those areas. How will I feel when I quit smoking? Day 1 to 3 weeks Within the first 24 hours of quitting smoking, you may start to feel withdrawal symptoms. These symptoms are usually most noticeable 2-3 days after quitting, but they usually do not last for more than 2-3 weeks. You may experience these symptoms:  Mood swings.  Restlessness, anxiety, or irritability.  Trouble concentrating.  Dizziness.  Strong cravings for sugary foods and nicotine.  Mild weight gain.  Constipation.  Nausea.  Coughing or a sore throat.  Changes in how the medicines that you take for unrelated issues work in your body.  Depression.  Trouble sleeping (insomnia). Week 3 and afterward After the first 2-3 weeks of quitting, you may start to notice more positive results, such as:  Improved sense of smell and taste.  Decreased coughing and sore throat.  Slower heart rate.  Lower blood pressure.  Clearer skin.  The ability to breathe more easily.  Fewer sick days. Quitting smoking can be very challenging. Do not get discouraged if you are not successful the first time. Some people need to make many attempts to quit before they achieve long-term success. Do your best to stick to your quit plan, and talk with your health care provider if you have any questions or concerns. Summary  Smoking tobacco is the leading cause of preventable death. Quitting smoking is one of the best things that you can do for your health.  When you decide to quit smoking, create a plan to help you succeed.  Quit smoking right away, not slowly over a period of time.  When you start quitting, seek help from your  health care provider, family, or friends. This information is not intended to replace advice given to you by your health care provider. Make sure you discuss any questions you have with your health care provider. Document Revised: 02/23/2019 Document Reviewed: 08/19/2018 Elsevier Patient Education  2020 [employer] Pregnancy and Smoking Smoking during pregnancy is unhealthy for you and your baby. Smoke from cigarettes, e-cigarettes, pipes, and cigars contains many chemicals that can cause cancer (carcinogens). These products also contain a stimulant drug (nicotine). When you smoke, harmful substances that you breathe in enter your bloodstream and can be passed on to your baby. This can affect your baby's development. If you are planning to become pregnant or have recently become pregnant, talk with your health care provider about quitting smoking. You have a much better chance of having a healthy pregnancy and a healthy baby if you do not smoke while you are pregnant. How does smoking affect me? Smoking increases your risk for many long-term (chronic) diseases. These diseases include cancer, lung diseases, and heart disease. Smoking during pregnancy increases your risk of:  Losing the pregnancy (miscarriage or stillbirth).  Giving birth too early (premature birth).  Pregnancy  outside of the uterus (tubal pregnancy).  Problems with the placenta, which is the organ that provides the baby nourishment and oxygen. These problems may include: ? Attachment of the placenta over the opening of the uterus (placenta previa). ? Detachment of the placenta before the baby's birth (placental abruption).  Having your water break before labor begins. How does smoking affect my baby? Before birth Smoking during pregnancy:  Decreases blood flow and oxygen to your baby.  Increases your baby's risk of birth defects, such as heart defects.  Increases your baby's heart rate.  Slows your baby's growth in  the uterus (intrauterine growth retardation). After birth Babies born to women who smoked during pregnancy may:  Have symptoms of nicotine withdrawal.  Be born with a cleft lip, cleft palate, or other facial deformities.  Be too small at birth.  Have a high risk of: ? Serious health problems or lifelong disabilities. These may result in the long-term need for certain medicines, therapies, or other treatments. ? Sudden infant death syndrome (SIDS). Follow these instructions at home:   Do not use any products that contain nicotine or tobacco, such as cigarettes, e-cigarettes, and chewing tobacco. If you need help quitting, ask your health care provider.  Talk with your health care provider about support strategies to quit smoking. Some methods to consider include: ? Counseling (smoking cessation counseling). ? Psychotherapy. ? Acupuncture. ? Hypnosis. ? Telephone hotlines for people trying to quit.  Do not take nicotine supplements or medicine to help you quit smoking unless your health care provider tells you to do so.  Avoid secondhand smoke. Ask people who smoke to avoid smoking around you.  Identify people, places, things, and activities that make you want to smoke (triggers). Avoid them. Where to find more information Learn more about smoking during pregnancy and quitting smoking from:  March of Dimes: www.marchofdimes.org  U.S. Department of Health and Human Services: women.smokefree.gov  American Cancer Society: www.cancer.org  American Heart Association: www.heart.org  National Cancer Institute: www.cancer.gov For help to quit smoking:  National smoking cessation telephone hotline: 1-800-QUIT NOW 714-843-5387) Contact a health care provider if:  You are struggling to quit smoking.  You are a smoker and you become pregnant or plan to become pregnant.  You start smoking again after giving birth. Summary  Smoking during pregnancy is unhealthy for you and your  baby.  Tobacco smoke contains harmful substances that can affect a baby's health and development.  Smoking increases the risk for serious problems, such as miscarriage, birth defects, or premature birth.  If you need help to quit smoking, talk to your health care provider and ask about support strategies such as counseling. This information is not intended to replace advice given to you by your health care provider. Make sure you discuss any questions you have with your health care provider. Document Revised: 12/29/2018 Document Reviewed: 12/29/2018 Elsevier Patient Education  2020 Elsevier Inc.   Fetal Movement Counts Patient Name: ________________________________________________ Patient Due Date: ____________________ What is a fetal movement count?  A fetal movement count is the number of times that you feel your baby move during a certain amount of time. This may also be called a fetal kick count. A fetal movement count is recommended for every pregnant woman. You may be asked to start counting fetal movements as early as week 28 of your pregnancy. Pay attention to when your baby is most active. You may notice your baby's sleep and wake cycles. You may also notice things that  make your baby move more. You should do a fetal movement count:  When your baby is normally most active.  At the same time each day. A good time to count movements is while you are resting, after having something to eat and drink. How do I count fetal movements? 1. Find a quiet, comfortable area. Sit, or lie down on your side. 2. Write down the date, the start time and stop time, and the number of movements that you felt between those two times. Take this information with you to your health care visits. 3. Write down your start time when you feel the first movement. 4. Count kicks, flutters, swishes, rolls, and jabs. You should feel at least 10 movements. 5. You may stop counting after you have felt 10 movements,  or if you have been counting for 2 hours. Write down the stop time. 6. If you do not feel 10 movements in 2 hours, contact your health care provider for further instructions. Your health care provider may want to do additional tests to assess your baby's well-being. Contact a health care provider if:  You feel fewer than 10 movements in 2 hours.  Your baby is not moving like he or she usually does. Date: ____________ Start time: ____________ Stop time: ____________ Movements: ____________ Date: ____________ Start time: ____________ Stop time: ____________ Movements: ____________ Date: ____________ Start time: ____________ Stop time: ____________ Movements: ____________ Date: ____________ Start time: ____________ Stop time: ____________ Movements: ____________ Date: ____________ Start time: ____________ Stop time: ____________ Movements: ____________ Date: ____________ Start time: ____________ Stop time: ____________ Movements: ____________ Date: ____________ Start time: ____________ Stop time: ____________ Movements: ____________ Date: ____________ Start time: ____________ Stop time: ____________ Movements: ____________ Date: ____________ Start time: ____________ Stop time: ____________ Movements: ____________ This information is not intended to replace advice given to you by your health care provider. Make sure you discuss any questions you have with your health care provider. Document Revised: 01/18/2019 Document Reviewed: 01/18/2019 Elsevier Patient Education  2020 Elsevier Inc.  Iron-Rich Diet  Iron is a mineral that helps your body to produce hemoglobin. Hemoglobin is a protein in red blood cells that carries oxygen to your body's tissues. Eating too little iron may cause you to feel weak and tired, and it can increase your risk of infection. Iron is naturally found in many foods, and many foods have iron added to them (iron-fortified foods). You may need to follow an iron-rich diet if  you do not have enough iron in your body due to certain medical conditions. The amount of iron that you need each day depends on your age, your sex, and any medical conditions you have. Follow instructions from your health care provider or a diet and nutrition specialist (dietitian) about how much iron you should eat each day. What are tips for following this plan? Reading food labels  Check food labels to see how many milligrams (mg) of iron are in each serving. Cooking  Cook foods in pots and pans that are made from iron.  Take these steps to make it easier for your body to absorb iron from certain foods: ? Soak beans overnight before cooking. ? Soak whole grains overnight and drain them before using. ? Ferment flours before baking, such as by using yeast in bread dough. Meal planning  When you eat foods that contain iron, you should eat them with foods that are high in vitamin C. These include oranges, peppers, tomatoes, potatoes, and mango. Vitamin C helps your body to  absorb iron. General information  Take iron supplements only as told by your health care provider. An overdose of iron can be life-threatening. If you were prescribed iron supplements, take them with orange juice or a vitamin C supplement.  When you eat iron-fortified foods or take an iron supplement, you should also eat foods that naturally contain iron, such as meat, poultry, and fish. Eating naturally iron-rich foods helps your body to absorb the iron that is added to other foods or contained in a supplement.  Certain foods and drinks prevent your body from absorbing iron properly. Avoid eating these foods in the same meal as iron-rich foods or with iron supplements. These foods include: ? Coffee, black tea, and red wine. ? Milk, dairy products, and foods that are high in calcium. ? Beans and soybeans. ? Whole grains. What foods should I eat? Fruits Prunes. Raisins. Eat fruits high in vitamin C, such as oranges,  grapefruits, and strawberries, alongside iron-rich foods. Vegetables Spinach (cooked). Green peas. Broccoli. Fermented vegetables. Eat vegetables high in vitamin C, such as leafy greens, potatoes, bell peppers, and tomatoes, alongside iron-rich foods. Grains Iron-fortified breakfast cereal. Iron-fortified whole-wheat bread. Enriched rice. Sprouted grains. Meats and other proteins Beef liver. Oysters. Beef. Shrimp. [place]. Chicken. Tuna. Sardines. Chickpeas. Nuts. Tofu. Pumpkin seeds. Beverages Tomato juice. Fresh orange juice. Prune juice. Hibiscus tea. Fortified instant breakfast shakes. Sweets and desserts Blackstrap molasses. Seasonings and condiments Tahini. Fermented soy sauce. Other foods Wheat germ. The items listed above may not be a complete list of recommended foods and beverages. Contact a dietitian for more information. What foods should I avoid? Grains Whole grains. Bran cereal. Bran flour. Oats. Meats and other proteins Soybeans. Products made from soy protein. Black beans. Lentils. Mung beans. Split peas. Dairy Milk. Cream. Cheese. Yogurt. Cottage cheese. Beverages Coffee. Black tea. Red wine. Sweets and desserts Cocoa. Chocolate. Ice cream. Other foods Basil. Oregano. Large amounts of parsley. The items listed above may not be a complete list of foods and beverages to avoid. Contact a dietitian for more information. Summary  Iron is a mineral that helps your body to produce hemoglobin. Hemoglobin is a protein in red blood cells that carries oxygen to your body's tissues.  Iron is naturally found in many foods, and many foods have iron added to them (iron-fortified foods).  When you eat foods that contain iron, you should eat them with foods that are high in vitamin C. Vitamin C helps your body to absorb iron.  Certain foods and drinks prevent your body from absorbing iron properly, such as whole grains and dairy products. You should avoid eating these foods in  the same meal as iron-rich foods or with iron supplements. This information is not intended to replace advice given to you by your health care provider. Make sure you discuss any questions you have with your health care provider. Document Revised: 05/13/2017 Document Reviewed: 04/26/2017 Elsevier Patient Education  2020 [employer].

## 2020-01-04 LAB — GLUCOSE TOLERANCE, 2 HOURS W/ 1HR
Glucose, 1 hour: 136 mg/dL (ref 65–179)
Glucose, 2 hour: 72 mg/dL (ref 65–152)
Glucose, Fasting: 70 mg/dL (ref 65–91)

## 2020-01-04 LAB — CBC
Hematocrit: 28.2 % — ABNORMAL LOW (ref 34.0–46.6)
Hemoglobin: 9.8 g/dL — ABNORMAL LOW (ref 11.1–15.9)
MCH: 32.6 pg (ref 26.6–33.0)
MCHC: 34.8 g/dL (ref 31.5–35.7)
MCV: 94 fL (ref 79–97)
Platelets: 175 10*3/uL (ref 150–450)
RBC: 3.01 x10E6/uL — ABNORMAL LOW (ref 3.77–5.28)
RDW: 12.4 % (ref 11.7–15.4)
WBC: 8.1 10*3/uL (ref 3.4–10.8)

## 2020-01-04 LAB — RPR: RPR Ser Ql: NONREACTIVE

## 2020-01-04 LAB — HIV ANTIBODY (ROUTINE TESTING W REFLEX): HIV Screen 4th Generation wRfx: NONREACTIVE

## 2020-01-07 ENCOUNTER — Encounter: Payer: Self-pay | Admitting: General Practice

## 2020-01-07 ENCOUNTER — Telehealth: Payer: Self-pay | Admitting: *Deleted

## 2020-01-07 DIAGNOSIS — O99019 Anemia complicating pregnancy, unspecified trimester: Secondary | ICD-10-CM

## 2020-01-07 MED ORDER — ASCORBIC ACID 500 MG PO TABS: ORAL_TABLET | ORAL | 3 refills | Status: DC

## 2020-01-07 MED ORDER — FERROUS SULFATE 325 (65 FE) MG PO TABS: ORAL_TABLET | ORAL | 3 refills | Status: DC

## 2020-01-07 NOTE — Telephone Encounter (Signed)
-----   Message from [place], [place] sent at 01/04/2020  8:41 AM EDT ----- Start on FeSO4 325 mg BID and vitamin c 1 po every other day

## 2020-01-17 ENCOUNTER — Encounter: Payer: Medicaid Other | Admitting: Obstetrics and Gynecology

## 2020-01-23 ENCOUNTER — Telehealth: Payer: Medicaid Other | Admitting: Obstetrics and Gynecology

## 2020-01-30 ENCOUNTER — Ambulatory Visit (INDEPENDENT_AMBULATORY_CARE_PROVIDER_SITE_OTHER): Payer: Medicaid Other | Admitting: Certified Nurse Midwife

## 2020-01-30 ENCOUNTER — Encounter: Payer: Self-pay | Admitting: General Practice

## 2020-01-30 ENCOUNTER — Other Ambulatory Visit: Payer: Self-pay

## 2020-01-30 VITALS — BP 112/73 | HR 86 | Temp 98.0°F | Wt 158.0 lb

## 2020-01-30 DIAGNOSIS — Z3A33 33 weeks gestation of pregnancy: Secondary | ICD-10-CM

## 2020-01-30 DIAGNOSIS — Z3483 Encounter for supervision of other normal pregnancy, third trimester: Secondary | ICD-10-CM

## 2020-01-30 NOTE — Progress Notes (Signed)
   PRENATAL VISIT NOTE  Subjective:  Bethany Decker is a 19 y.o. G2P0010 at [redacted]w[redacted]d being seen today for ongoing prenatal care.  She is currently monitored for the following issues for this low-risk pregnancy and has Foster care (status); Clonazepam overdose of undetermined intent; Suicidal ideation; MDD (major depressive disorder); Intentional drug overdose (HCC); and Supervision of other normal pregnancy, antepartum on their problem list.  Patient reports backache and no other complaints.  Contractions: Not present. Vag. Bleeding: None.  Movement: Present. Denies leaking of fluid.   The following portions of the patient's history were reviewed and updated as appropriate: allergies, current medications, past family history, past medical history, past social history, past surgical history and problem list.   Objective:   Vitals:   01/30/20 1515  BP: 112/73  Pulse: 86  Temp: 98 F (36.7 C)  Weight: 158 lb (71.7 kg)    Fetal Status: Fetal Heart Rate (bpm): 154 Fundal Height: 32 cm Movement: Present     General:  Alert, oriented and cooperative. Patient is in no acute distress.  Skin: Skin is warm and dry. No rash noted.   Cardiovascular: Normal heart rate noted  Respiratory: Normal respiratory effort, no problems with respiration noted  Abdomen: Soft, gravid, appropriate for gestational age.  Pain/Pressure: Absent     Pelvic: Cervical exam deferred        Extremities: Normal range of motion.  Edema: None  Mental Status: Normal mood and affect. Normal behavior. Normal judgment and thought content.   Assessment and Plan:  Pregnancy: G2P0010 at [redacted]w[redacted]d 1. Encounter for supervision of other normal pregnancy in third trimester - Encouraged stretches to relieve low back pain and discussed pain relief options in labor.  - Pt desires waterbirth, consent signed and info given for pt to attend class. Will bring certificate to [place] once she has attended.  2. [redacted] weeks gestation of pregnancy -  Anticipatory guidance given re: next visit and GBS testing  Preterm labor symptoms and general obstetric precautions including but not limited to vaginal bleeding, contractions, leaking of fluid and fetal movement were reviewed in detail with the patient. Please refer to After Visit Summary for other counseling recommendations.   Return in about 3 weeks (around 02/20/2020) for IN-PERSON, LOB w GBS.  Future Appointments  Date Time Provider Department Center  02/21/2020  9:50 AM Raelyn Mora, CNM [facility] None  02/28/2020  2:50 PM Raelyn Mora, CNM [facility] None  03/06/2020  2:50 PM Raelyn Mora, CNM [facility] None  03/13/2020  2:50 PM Marny Lowenstein, PA-C [facility] None   Edd Arbour, CNM, MSN, [facility] 01/30/20 3:53 PM

## 2020-01-30 NOTE — Patient Instructions (Addendum)
Considering Waterbirth? Guide for patients at [facility] Why consider waterbirth? . Gentle birth for babies  . Less pain medicine used in labor  . May allow for passive descent/less pushing  . May reduce perineal tears  . More mobility and instinctive maternal position changes  . Increased maternal relaxation  . Reduced blood pressure in labor   Is waterbirth safe? What are the risks of infection, drowning or other complications? . Infection:  Marland Kitchen Very low risk (3.7 % for tub vs 4.8% for bed)  . 7 in 8000 waterbirths with documented infection  . Poorly cleaned equipment most common cause  . Slightly lower group B strep transmission rate  . Drowning  . Maternal:  . Very low risk  . Related to seizures or fainting  . Newborn:  Marland Kitchen Very low risk. No evidence of increased risk of respiratory problems in multiple large studies  . Physiological protection from breathing under water  . Avoid underwater birth if there are any fetal complications  . Once baby's head is out of the water, keep it out.  . Birth complication  . Some reports of cord trauma, but risk decreased by bringing baby to surface gradually  . No evidence of increased risk of shoulder dystocia. Mothers can usually change positions faster in water than in a bed, possibly aiding the maneuvers to free the shoulder.  ? You must attend a Waterbirth class at [employer] at [facility] . 3rd Wednesday of every month from 7-9pm  . Free  . Register by calling [phone] or online at [email or web address]  . Bring [place] the certificate from the class to your prenatal appointment  Meet with a midwife at 36 weeks to see if you can still plan a waterbirth and to sign the consent.   If you plan a waterbirth at [facility] and [facility], you can opt to purchase the following: . Fish Net . Bathing suit top (optional)  . Long-handled mirror (optional)  .  Things that would  prevent you from having a waterbirth: . Unknown or Positive COVID-19 diagnosis upon admission to hospital  . Premature, <37wks  . Previous cesarean birth  . Presence of thick meconium-stained fluid  . Multiple gestation (Twins, triplets, etc.)  . Uncontrolled diabetes or gestational diabetes requiring medication  . Hypertension requiring medication or diagnosis of pre-eclampsia  . Heavy vaginal bleeding  . Non-reassuring fetal heart rate  . Active infection (MRSA, etc.). Group B Strep is NOT a contraindication for waterbirth.  . If your labor has to be induced and induction method requires continuous monitoring of the baby's heart rate  . Other risks/issues identified by your obstetrical provider  Please remember that birth is unpredictable. Under certain unforeseeable circumstances your provider may advise against giving birth in the tub. These decisions will be made on a case-by-case basis and with the safety of you and your baby as our highest priority.  **Please remember that in order to have a waterbirth, you must test Negative to COVID-19 upon admission to the hospital.**   Group B Streptococcus Test During Pregnancy Why am I having this test? Routine testing, also called screening, for group B streptococcus (GBS) is recommended for all pregnant women between the 36th and 37th week of pregnancy. GBS is a type of bacteria that can be passed from mother to baby during childbirth. Screening will help guide whether or not you will need treatment during labor and delivery to prevent complications  such as:  An infection in your uterus during labor.  An infection in your uterus after delivery.  A serious infection in your baby after delivery, such as pneumonia, meningitis, or sepsis. GBS screening is not often done before 36 weeks of pregnancy unless you go into labor prematurely. What happens if I have group B streptococcus? If testing shows that you have GBS, your health care provider  will recommend treatment with IV antibiotics during labor and delivery. This treatment significantly decreases the risk of complications for you and your baby. If you have a planned C-section and you have GBS, you may not need to be treated with antibiotics because GBS is usually passed to babies after labor starts and your water breaks. If you are in labor or your water breaks before your C-section, it is possible for GBS to get into your uterus and be passed to your baby, so you might need treatment. Is there a chance I may not need to be tested? You may not need to be tested for GBS if:  You have a urine test that shows GBS before 36 to 37 weeks.  You had a baby with GBS infection after a previous delivery. In these cases, you will automatically be treated for GBS during labor and delivery. What is being tested? This test is done to check if you have group B streptococcus in your vagina or rectum. What kind of sample is taken? To collect samples for this test, your health care provider will swab your vagina and rectum with a cotton swab. The sample is then sent to the lab to see if GBS is present. What happens during the test?   You will remove your clothing from the waist down.  You will lie down on an exam table in the same position as you would for a pelvic exam.  Your health care provider will swab your vagina and rectum to collect samples for a culture test.  You will be able to go home after the test and do all your usual activities. How are the results reported? The test results are reported as positive or negative. What do the results mean?  A positive test means you are at risk for passing GBS to your baby during labor and delivery. Your health care provider will recommend that you are treated with an IV antibiotic during labor and delivery.  A negative test means you are at very low risk of passing GBS to your baby. There is still a low risk of passing GBS to your baby  because sometimes test results may report that you do not have a condition when you do (false-negative result) or there is a chance that you may become infected with GBS after the test is done. You most likely will not need to be treated with an antibiotic during labor and delivery. Talk with your health care provider about what your results mean. Questions to ask your health care provider Ask your health care provider, or the department that is doing the test:  When will my results be ready?  How will I get my results?  What are my treatment options? Summary  Routine testing (screening) for group B streptococcus (GBS) is recommended for all pregnant women between the 36th and 37th week of pregnancy.  GBS is a type of bacteria that can be passed from mother to baby during childbirth.  If testing shows that you have GBS, your health care provider will recommend that you are treated with  IV antibiotics during labor and delivery. This treatment almost always prevents infection in newborns. This information is not intended to replace advice given to you by your health care provider. Make sure you discuss any questions you have with your health care provider. Document Revised: 09/21/2018 Document Reviewed: 06/28/2018 Elsevier Patient Education  2020 [employer].

## 2020-02-21 ENCOUNTER — Ambulatory Visit (INDEPENDENT_AMBULATORY_CARE_PROVIDER_SITE_OTHER): Payer: Medicaid Other | Admitting: Obstetrics and Gynecology

## 2020-02-21 ENCOUNTER — Encounter: Payer: Self-pay | Admitting: Obstetrics and Gynecology

## 2020-02-21 ENCOUNTER — Other Ambulatory Visit (HOSPITAL_COMMUNITY)
Admission: RE | Admit: 2020-02-21 | Discharge: 2020-02-21 | Disposition: A | Discharge status: Home / Self Care | Payer: Medicaid Other | Source: Ambulatory Visit | Attending: Obstetrics and Gynecology | Admitting: Obstetrics and Gynecology

## 2020-02-21 ENCOUNTER — Other Ambulatory Visit: Payer: Self-pay

## 2020-02-21 ENCOUNTER — Encounter: Payer: Self-pay | Admitting: General Practice

## 2020-02-21 VITALS — BP 124/75 | HR 72 | Wt 160.0 lb

## 2020-02-21 DIAGNOSIS — Z3A36 36 weeks gestation of pregnancy: Secondary | ICD-10-CM

## 2020-02-21 DIAGNOSIS — Z3480 Encounter for supervision of other normal pregnancy, unspecified trimester: Secondary | ICD-10-CM | POA: Insufficient documentation

## 2020-02-21 DIAGNOSIS — O99891 Other specified diseases and conditions complicating pregnancy: Secondary | ICD-10-CM

## 2020-02-21 DIAGNOSIS — S3141XA Laceration without foreign body of vagina and vulva, initial encounter: Secondary | ICD-10-CM

## 2020-02-21 DIAGNOSIS — M549 Dorsalgia, unspecified: Secondary | ICD-10-CM

## 2020-02-21 DIAGNOSIS — O99019 Anemia complicating pregnancy, unspecified trimester: Secondary | ICD-10-CM

## 2020-02-21 MED ORDER — COMFORT FIT MATERNITY SUPP SM MISC: 1.0000 [IU] | Freq: Every day | 0 refills | Status: DC | PRN

## 2020-02-21 NOTE — Patient Instructions (Signed)

## 2020-02-21 NOTE — Progress Notes (Signed)
   LOW-RISK PREGNANCY OFFICE VISIT Patient name: Bethany Decker MRN [id number]  Date of birth: 01-29-2001 Chief Complaint:   Routine Prenatal Visit  History of Present Illness:   Bethany Decker is a 19 y.o. G74P0010 female at [redacted]w[redacted]d with an Estimated Date of Delivery: 03/15/20 being seen today for ongoing management of a low-risk pregnancy.  Today she reports she reports a vaginal laceration immediately after SI on 02/06/20. She states it was completely healed up the next day. Contractions: Not present. Vag. Bleeding: None.  Movement: Present. denies leaking of fluid. Review of Systems:   Pertinent items are noted in HPI Denies abnormal vaginal discharge w/ itching/odor/irritation, headaches, visual changes, shortness of breath, chest pain, abdominal pain, severe nausea/vomiting, or problems with urination or bowel movements unless otherwise stated above. Pertinent History Reviewed:  Reviewed past medical,surgical, social, obstetrical and family history.  Reviewed problem list, medications and allergies. Physical Assessment:   Vitals:   02/21/20 0928  BP: 124/75  Pulse: 72  Weight: 160 lb (72.6 kg)  Body mass index is 25.82 kg/m.        Physical Examination:   General appearance: Well appearing, and in no distress  Mental status: Alert, oriented to person, place, and time  Skin: Warm & dry  Cardiovascular: Normal heart rate noted  Respiratory: Normal respiratory effort, no distress  Abdomen: Soft, gravid, nontender  Pelvic: Cervical exam performed   Image immediately after incident:     Image from today's exam:    Extremities: Edema: None  Fetal Status: Fetal Heart Rate (bpm): 132 Fundal Height: 37 cm Movement: Present Presentation: Vertex  No results found for this or any previous visit (from the past 24 hour(s)).  Assessment & Plan:  1) Low-risk pregnancy G2P0010 at [redacted]w[redacted]d with an Estimated Date of Delivery: 03/15/20   2) Supervision of other normal pregnancy,  antepartum - Anticipatory guidance for next visits until delivery  3) Back pain affecting pregnancy in third trimester - Rx for maternity support belt and instructions on where to go to pick up  4) Anemia affecting pregnancy, antepartum - Taking iron supplements  5) [redacted] weeks gestation of pregnancy  6) Non-obstetric vaginal laceration without foreign body or perineal laceration, initial encounter    Meds: No orders of the defined types were placed in this encounter.  Labs/procedures today: GBS, GC/CT and cervical exam  Plan:  Continue routine obstetrical care   Reviewed: Preterm labor symptoms and general obstetric precautions including but not limited to vaginal bleeding, contractions, leaking of fluid and fetal movement were reviewed in detail with the patient.  All questions were answered. Has home bp cuff. Check bp weekly, let us know if >140/90.   Follow-up: Return in about 1 week (around 02/28/2020) for Return OB visit.  No orders of the defined types were placed in this encounter.  Raelyn Mora MSN, CNM 02/21/2020

## 2020-02-22 LAB — GC/CHLAMYDIA PROBE AMP (~~LOC~~) NOT AT ARMC
Chlamydia: NEGATIVE
Comment: NEGATIVE
Comment: NORMAL
Neisseria Gonorrhea: NEGATIVE

## 2020-02-25 LAB — CULTURE, BETA STREP (GROUP B ONLY): Strep Gp B Culture: NEGATIVE

## 2020-02-28 ENCOUNTER — Ambulatory Visit (INDEPENDENT_AMBULATORY_CARE_PROVIDER_SITE_OTHER): Payer: Medicaid Other | Admitting: Obstetrics and Gynecology

## 2020-02-28 ENCOUNTER — Other Ambulatory Visit: Payer: Self-pay

## 2020-02-28 VITALS — BP 123/83 | HR 70 | Temp 98.2°F | Wt 158.8 lb

## 2020-02-28 DIAGNOSIS — Z3480 Encounter for supervision of other normal pregnancy, unspecified trimester: Secondary | ICD-10-CM

## 2020-02-28 DIAGNOSIS — Z3A37 37 weeks gestation of pregnancy: Secondary | ICD-10-CM

## 2020-02-29 ENCOUNTER — Encounter: Payer: Self-pay | Admitting: Obstetrics and Gynecology

## 2020-02-29 NOTE — Progress Notes (Signed)
   LOW-RISK PREGNANCY OFFICE VISIT Patient name: Bethany Decker MRN [id number]  Date of birth: April 17, 2001 Chief Complaint:   Routine Prenatal Visit  History of Present Illness:   Bethany Decker is a 19 y.o. G75P0010 female at [redacted]w[redacted]d with an Estimated Date of Delivery: 03/15/20 being seen today for ongoing management of a low-risk pregnancy.  Today she reports no complaints. Contractions: Not present. Vag. Bleeding: None.  Movement: Present. denies leaking of fluid. Review of Systems:   Pertinent items are noted in HPI Denies abnormal vaginal discharge w/ itching/odor/irritation, headaches, visual changes, shortness of breath, chest pain, abdominal pain, severe nausea/vomiting, or problems with urination or bowel movements unless otherwise stated above. Pertinent History Reviewed:  Reviewed past medical,surgical, social, obstetrical and family history.  Reviewed problem list, medications and allergies. Physical Assessment:   Vitals:   02/28/20 1503  BP: 123/83  Pulse: 70  Temp: 98.2 F (36.8 C)  Weight: 158 lb 12.8 oz (72 kg)  Body mass index is 25.63 kg/m.        Physical Examination:   General appearance: Well appearing, and in no distress  Mental status: Alert, oriented to person, place, and time  Skin: Warm & dry  Cardiovascular: Normal heart rate noted  Respiratory: Normal respiratory effort, no distress  Abdomen: Soft, gravid, nontender  Pelvic: Cervical exam deferred         Extremities: Edema: None  Fetal Status: Fetal Heart Rate (bpm): 142 Fundal Height: 38 cm Movement: Present    No results found for this or any previous visit (from the past 24 hour(s)).  Assessment & Plan:  1) Low-risk pregnancy G2P0010 at [redacted]w[redacted]d with an Estimated Date of Delivery: 03/15/20   2) Supervision of other normal pregnancy, antepartum - Prepared for waterbirth - Needs to sign consent at next visit  3) [redacted] weeks gestation of pregnancy    Meds: No orders of the defined types were  placed in this encounter.  Labs/procedures today: none  Plan:  Continue routine obstetrical care   Reviewed: Term labor symptoms and general obstetric precautions including but not limited to vaginal bleeding, contractions, leaking of fluid and fetal movement were reviewed in detail with the patient.  All questions were answered. Has home bp cuff.Check bp weekly, let us know if >140/90.   Follow-up: Return in about 1 week (around 03/06/2020) for Return OB visit.  No orders of the defined types were placed in this encounter.  Raelyn Mora MSN, CNM 02/28/2020

## 2020-03-05 ENCOUNTER — Inpatient Hospital Stay (HOSPITAL_COMMUNITY)
Admission: AD | Admit: 2020-03-05 | Discharge: 2020-03-05 | Disposition: A | Discharge status: Home / Self Care | Payer: Medicaid Other | Attending: Obstetrics and Gynecology | Admitting: Obstetrics and Gynecology

## 2020-03-05 ENCOUNTER — Other Ambulatory Visit: Payer: Self-pay

## 2020-03-05 ENCOUNTER — Encounter (HOSPITAL_COMMUNITY): Payer: Self-pay | Admitting: Obstetrics and Gynecology

## 2020-03-05 DIAGNOSIS — Z3A38 38 weeks gestation of pregnancy: Secondary | ICD-10-CM | POA: Insufficient documentation

## 2020-03-05 DIAGNOSIS — Z3480 Encounter for supervision of other normal pregnancy, unspecified trimester: Secondary | ICD-10-CM

## 2020-03-05 DIAGNOSIS — Z0371 Encounter for suspected problem with amniotic cavity and membrane ruled out: Secondary | ICD-10-CM

## 2020-03-05 DIAGNOSIS — O471 False labor at or after 37 completed weeks of gestation: Secondary | ICD-10-CM | POA: Diagnosis not present

## 2020-03-05 LAB — POCT FERN TEST: POCT Fern Test: NEGATIVE

## 2020-03-05 MED ORDER — POLYETHYLENE GLYCOL 3350 17 G PO PACK: 17.0000 g | PACK | Freq: Every day | ORAL | 0 refills | Status: DC

## 2020-03-05 MED ORDER — DOCUSATE SODIUM 100 MG PO CAPS: 100.0000 mg | ORAL_CAPSULE | Freq: Two times a day (BID) | ORAL | 2 refills | Status: DC | PRN

## 2020-03-05 NOTE — Discharge Instructions (Signed)
Hemorrhoids Hemorrhoids are swollen veins that may develop:  In the butt (rectum). These are called internal hemorrhoids.  Around the opening of the butt (anus). These are called external hemorrhoids. Hemorrhoids can cause pain, itching, or bleeding. Most of the time, they do not cause serious problems. They usually get better with diet changes, lifestyle changes, and other home treatments. What are the causes? This condition may be caused by:  Having trouble pooping (constipation).  Pushing hard (straining) to poop.  Watery poop (diarrhea).  Pregnancy.  Being very overweight (obese).  Sitting for long periods of time.  Heavy lifting or other activity that causes you to strain.  Anal sex.  Riding a bike for a long period of time. What are the signs or symptoms? Symptoms of this condition include:  Pain.  Itching or soreness in the butt.  Bleeding from the butt.  Leaking poop.  Swelling in the area.  One or more lumps around the opening of your butt. How is this diagnosed? A doctor can often diagnose this condition by looking at the affected area. The doctor may also:  Do an exam that involves feeling the area with a gloved hand (digital rectal exam).  Examine the area inside your butt using a small tube (anoscope).  Order blood tests. This may be done if you have lost a lot of blood.  Have you get a test that involves looking inside the colon using a flexible tube with a camera on the end (sigmoidoscopy or colonoscopy). How is this treated? This condition can usually be treated at home. Your doctor may tell you to change what you eat, make lifestyle changes, or try home treatments. If these do not help, procedures can be done to remove the hemorrhoids or make them smaller. These may involve:  Placing rubber bands at the base of the hemorrhoids to cut off their blood supply.  Injecting medicine into the hemorrhoids to shrink them.  Shining a type of light  energy onto the hemorrhoids to cause them to fall off.  Doing surgery to remove the hemorrhoids or cut off their blood supply. Follow these instructions at home: Eating and drinking   Eat foods that have a lot of fiber in them. These include whole grains, beans, nuts, fruits, and vegetables.  Ask your doctor about taking products that have added fiber (fibersupplements).  Reduce the amount of fat in your diet. You can do this by: ? Eating low-fat dairy products. ? Eating less red meat. ? Avoiding processed foods.  Drink enough fluid to keep your pee (urine) pale yellow. Managing pain and swelling   Take a warm-water bath (sitz bath) for 20 minutes to ease pain. Do this 3-4 times a day. You may do this in a bathtub or using a portable sitz bath that fits over the toilet.  If told, put ice on the painful area. It may be helpful to use ice between your warm baths. ? Put ice in a plastic bag. ? Place a towel between your skin and the bag. ? Leave the ice on for 20 minutes, 2-3 times a day. General instructions  Take over-the-counter and prescription medicines only as told by your doctor. ? Medicated creams and medicines may be used as told.  Exercise often. Ask your doctor how much and what kind of exercise is best for you.  Go to the bathroom when you have the urge to poop. Do not wait.  Avoid pushing too hard when you poop.  Keep your   butt dry and clean. Use wet toilet paper or moist towelettes after pooping.  Do not sit on the toilet for a long time.  Keep all follow-up visits as told by your doctor. This is important. Contact a doctor if you:  Have pain and swelling that do not get better with treatment or medicine.  Have trouble pooping.  Cannot poop.  Have pain or swelling outside the area of the hemorrhoids. Get help right away if you have:  Bleeding that will not stop. Summary  Hemorrhoids are swollen veins in the butt or around the opening of the  butt.  They can cause pain, itching, or bleeding.  Eat foods that have a lot of fiber in them. These include whole grains, beans, nuts, fruits, and vegetables.  Take a warm-water bath (sitz bath) for 20 minutes to ease pain. Do this 3-4 times a day. This information is not intended to replace advice given to you by your health care provider. Make sure you discuss any questions you have with your health care provider. Document Revised: 06/08/2018 Document Reviewed: 10/20/2017 Elsevier Patient Education  2020 [employer].   First Stage of Labor Labor is your body's natural process of moving your baby and other structures, including the placenta and umbilical cord, out of your uterus. There are three stages of labor. How long each stage lasts is different for every woman. But certain events happen during each stage that are the same for everyone.  The first stage starts when true labor begins. This stage ends when your cervix, which is the opening from your uterus into your vagina, is completely open (dilated).  The second stage begins when your cervix is fully dilated and you start pushing. This stage ends when your baby is born.  The third stage is the delivery of the organ that nourished your baby during pregnancy (placenta). First stage of labor As your due date gets closer, you may start to notice certain physical changes that mean labor is going to start soon. You may feel that your baby has dropped lower into your pelvis. You may experience irregular, often painless, contractions that go away when you walk around or lie down ([employer] contractions). This is also called false labor. The first stage of labor begins when you start having contractions that come at regular (evenly spaced) intervals and your cervix starts to get thinner and wider in preparation for your baby to pass through. Birth care providers measure the dilation of your cervix in centimeters (cm). One centimeter is a  little less than one-half of an inch. The first stage ends when your cervix is dilated to 10 cm. The first stage of labor is divided into three phases:  Early phase.  Active phase.  Transitional phase. The length of the first stage of labor varies. It may be longer if this is your first pregnancy. You may spend most of this stage at home trying to relax and stay comfortable. How does this affect me? During the first stage of labor, you will move through three phases. What happens in the early phase?  You will start to have regular contractions that last 30-60 seconds. Contractions may come every 5-20 minutes. Keep track of your contractions and call your birth care provider.  Your water may break during this phase.  You may notice a clear or slightly bloody discharge of mucus (mucus plug) from your vagina.  Your cervix will dilate to 3-6 cm. What happens in the active phase? The  active phase usually lasts 3-5 hours. You may go to the hospital or birth center around this time. During the active phase:  Your contractions will become stronger, longer, and more uncomfortable.  Your contractions may last 45-90 seconds and come every 3-5 minutes.  You may feel lower back pain.  Your birth care providers may examine your cervix and feel your belly to find the position of your baby.  You may have a monitor strapped to your belly to measure your contractions and your baby's heart rate.  You may start using your pain management options.  Your cervix may be dilated to 6 cm and may start to dilate more quickly. What happens in the transitional phase? The transitional phase typically lasts from 30 minutes to 2 hours. At the end of this phase, your cervix will be fully dilated to 10 cm. During the transitional phase:  Contractions will get stronger and longer.  Contractions may last 60-90 seconds and come less than 2 minutes apart.  You may feel hot flashes, chills, or nausea. How does  this affect my baby? During the first stage of labor, your baby will gradually move down into your birth canal. Follow these instructions at home and in the hospital or birth center:   When labor first begins, try to stay calm. You are still in the early phase. If it is night, try to get some sleep. If it is day, try to relax and save your energy. You may want to make some calls and get ready to go to the hospital or birth center.  When you are in the early phase, try these methods to help ease discomfort: ? Deep breathing and muscle relaxation. ? Taking a walk. ? Taking a warm bath or shower.  Drink some fluids and have a light snack if you feel like it.  Keep track of your contractions.  Based on the plan you created with your birth care provider, call when your contractions indicate it is time.  If your water breaks, note the time, color, and odor of the fluid.  When you are in the active phase, do your breathing exercises and rely on your support people and your team of birth care providers. Contact a health care provider if:  Your contractions are strong and regular.  You have lower back pain or cramping.  Your water breaks.  You lose your mucus plug. Get help right away if you:  Have a severe headache that does not go away.  Have changes in your vision.  Have severe pain in your upper belly.  Do not feel the baby move.  Have bright red bleeding. Summary  The first stage of labor starts when true labor begins, and it ends when your cervix is dilated to 10 cm.  The first stage of labor has three phases: early, active, and transitional.  Your baby moves into the birth canal during the first stage of labor.  You may have contractions that become stronger and longer. You may also lose your mucus plug and have your water break.  Call your birth care provider when your contractions are frequent and strong enough to go to the hospital or birth center. This  information is not intended to replace advice given to you by your health care provider. Make sure you discuss any questions you have with your health care provider. Document Revised: 09/21/2018 Document Reviewed: 08/14/2017 Elsevier Patient Education  2020 [employer].

## 2020-03-05 NOTE — MAU Provider Note (Signed)
First Provider Initiated Contact with Patient 03/05/20 1403     S: [name] is a 19 y.o. G2P0010 at [redacted]w[redacted]d  who presents to MAU today complaining of leaking of fluid since 03/04/2020 when she lost her mucus plus. She denies vaginal bleeding. She endorses mild contractions. She reports normal fetal movement.    O: BP 130/82 (BP Location: Right Arm)   Pulse (!) 51   Temp 98.3 F (36.8 C) (Oral)   Resp 18   LMP 05/14/2019   SpO2 98%  GENERAL: Well-developed, well-nourished female in no acute distress.  HEAD: Normocephalic, atraumatic.  CHEST: Normal effort of breathing, regular heart rate ABDOMEN: Soft, nontender, gravid PELVIC: Normal external female genitalia. Vagina is pink and rugated. Cervix with normal contour, no lesions. Normal discharge.  Negative pooling. Negative fern  Cervical exam:  Dilation: 1 Effacement (%): 70 Cervical Position: Posterior Station: -2 Presentation: Undeterminable Exam by:: F. Morris, RNC   Fetal Monitoring: Baseline: 135 Variability: Mod Accelerations: 15 x 15 Decelerations: None Contractions: Occasional  Results for orders placed or performed during the hospital encounter of 03/05/20 (from the past 24 hour(s))  POCT fern test     Status: None   Collection Time: 03/05/20  2:23 PM  Result Value Ref Range   POCT Fern Test Negative = intact amniotic membranes    A: SIUP at [redacted]w[redacted]d  Intact amniotic sac Reactive tracing Uncomplicated hemorrhoids, advised daily regimen  Meds ordered this encounter  Medications  . polyethylene glycol (MIRALAX) 17 g packet    Sig: Take 17 g by mouth daily.    Dispense:  14 each    Refill:  0    Order Specific Question:   Supervising Provider    Answer:   Conan Bowens [[id number]]  . docusate sodium (COLACE) 100 MG capsule    Sig: Take 1 capsule (100 mg total) by mouth 2 (two) times daily as needed.    Dispense:  30 capsule    Refill:  2    Order Specific Question:   Supervising Provider     Answer:   Conan Bowens [[id number]]     P: Discharge home in stable condition  Clayton Bibles, [place] 03/05/2020 3:23 PM

## 2020-03-05 NOTE — MAU Note (Signed)
Started losing her plug on Mon.  Has also been leaking clear watery fluid since then.  Denies bleeding or pain.  Reports  +FM.  Was 1 cm 2  wks ago.

## 2020-03-06 ENCOUNTER — Encounter: Payer: Self-pay | Admitting: Obstetrics and Gynecology

## 2020-03-06 ENCOUNTER — Ambulatory Visit (INDEPENDENT_AMBULATORY_CARE_PROVIDER_SITE_OTHER): Payer: Medicaid Other | Admitting: Obstetrics and Gynecology

## 2020-03-06 VITALS — BP 140/96 | HR 57 | Temp 98.3°F | Wt 159.8 lb

## 2020-03-06 DIAGNOSIS — Z3483 Encounter for supervision of other normal pregnancy, third trimester: Secondary | ICD-10-CM

## 2020-03-06 DIAGNOSIS — O163 Unspecified maternal hypertension, third trimester: Secondary | ICD-10-CM | POA: Insufficient documentation

## 2020-03-06 DIAGNOSIS — Z3A38 38 weeks gestation of pregnancy: Secondary | ICD-10-CM

## 2020-03-06 NOTE — Progress Notes (Signed)
   LOW-RISK PREGNANCY OFFICE VISIT Patient name: Bethany Decker MRN [id number]  Date of birth: Apr 04, 2001 Chief Complaint:   Routine Prenatal Visit  History of Present Illness:   Bethany Decker is a 19 y.o. G104P0010 female at [redacted]w[redacted]d with an Estimated Date of Delivery: 03/15/20 being seen today for ongoing management of a low-risk pregnancy.  Today she reports occasional contractions. Contractions: Not present. Vag. Bleeding: None.  Movement: Present. denies leaking of fluid. Review of Systems:   Pertinent items are noted in HPI Denies abnormal vaginal discharge w/ itching/odor/irritation, headaches, visual changes, shortness of breath, chest pain, abdominal pain, severe nausea/vomiting, or problems with urination or bowel movements unless otherwise stated above. Pertinent History Reviewed:  Reviewed past medical,surgical, social, obstetrical and family history.  Reviewed problem list, medications and allergies. Physical Assessment:   Vitals:   03/06/20 1447 03/06/20 1536  BP: 134/90 (!) 140/96  Pulse: 60 (!) 57  Temp: 98.3 F (36.8 C)   Weight: 159 lb 12.8 oz (72.5 kg)   Body mass index is 25.79 kg/m.        Physical Examination:   General appearance: Well appearing, and in no distress  Mental status: Alert, oriented to person, place, and time  Skin: Warm & dry  Cardiovascular: Normal heart rate noted  Respiratory: Normal respiratory effort, no distress  Abdomen: Soft, gravid, nontender  Pelvic: Cervical exam deferred         Extremities: Edema: None  Fetal Status: Fetal Heart Rate (bpm): 150 Fundal Height: 38 cm Movement: Present    No results found for this or any previous visit (from the past 24 hour(s)).  Assessment & Plan:  1) Low-risk pregnancy G2P0010 at [redacted]w[redacted]d with an Estimated Date of Delivery: 03/15/20   2) Encounter for supervision of other normal pregnancy in third trimester  3) Elevated blood pressure affecting pregnancy in third trimester, antepartum  -  Consult with Dr. Alysia Penna @ 1530 - notified of patient's complaints, assessments, & BP trends, recommended tx plan stat PEC labs a BP recheck in office tomorrow - ok to d/c home  - Protein / creatinine ratio, urine,  - CBC,  - Comprehensive metabolic panel  4) [redacted] weeks gestation of pregnancy    Meds: No orders of the defined types were placed in this encounter.  Labs/procedures today: PEC labs  Plan:  Continue routine obstetrical care   Reviewed: Term labor symptoms and general obstetric precautions including but not limited to vaginal bleeding, contractions, leaking of fluid and fetal movement were reviewed in detail with the patient.  All questions were answered. Has home bp cuff. Check bp weekly, let us know if >140/90.   Follow-up: Return in about 1 week (around 03/13/2020) for Return OB visit.  Orders Placed This Encounter  Procedures  . Protein / creatinine ratio, urine  . CBC  . Comprehensive metabolic panel   Raelyn Mora MSN, CNM 03/06/2020 3:37 PM

## 2020-03-07 ENCOUNTER — Encounter (HOSPITAL_COMMUNITY)
Admission: AD | Disposition: A | Discharge status: Home / Self Care | Payer: Self-pay | Source: Home / Self Care | Attending: Obstetrics & Gynecology

## 2020-03-07 ENCOUNTER — Inpatient Hospital Stay (HOSPITAL_COMMUNITY)
Admission: AD | Admit: 2020-03-07 | Discharge: 2020-03-10 | DRG: 787 | Disposition: A | Discharge status: Home / Self Care | Payer: Medicaid Other | Attending: Obstetrics & Gynecology | Admitting: Obstetrics & Gynecology

## 2020-03-07 ENCOUNTER — Inpatient Hospital Stay (HOSPITAL_COMMUNITY): Payer: Medicaid Other | Admitting: Anesthesiology

## 2020-03-07 ENCOUNTER — Encounter (HOSPITAL_COMMUNITY): Payer: Self-pay | Admitting: Obstetrics & Gynecology

## 2020-03-07 ENCOUNTER — Ambulatory Visit: Payer: Medicaid Other

## 2020-03-07 ENCOUNTER — Ambulatory Visit (INDEPENDENT_AMBULATORY_CARE_PROVIDER_SITE_OTHER): Payer: Medicaid Other | Admitting: *Deleted

## 2020-03-07 ENCOUNTER — Other Ambulatory Visit: Payer: Self-pay

## 2020-03-07 VITALS — BP 135/93 | HR 61 | Temp 98.1°F | Ht 66.0 in | Wt 161.8 lb

## 2020-03-07 DIAGNOSIS — Z6221 Child in welfare custody: Secondary | ICD-10-CM

## 2020-03-07 DIAGNOSIS — O321XX0 Maternal care for breech presentation, not applicable or unspecified: Secondary | ICD-10-CM | POA: Diagnosis present

## 2020-03-07 DIAGNOSIS — Z87891 Personal history of nicotine dependence: Secondary | ICD-10-CM | POA: Diagnosis not present

## 2020-03-07 DIAGNOSIS — O9081 Anemia of the puerperium: Secondary | ICD-10-CM | POA: Diagnosis not present

## 2020-03-07 DIAGNOSIS — F329 Major depressive disorder, single episode, unspecified: Secondary | ICD-10-CM | POA: Diagnosis present

## 2020-03-07 DIAGNOSIS — Z20822 Contact with and (suspected) exposure to covid-19: Secondary | ICD-10-CM | POA: Diagnosis present

## 2020-03-07 DIAGNOSIS — O99344 Other mental disorders complicating childbirth: Secondary | ICD-10-CM | POA: Diagnosis present

## 2020-03-07 DIAGNOSIS — Z0130 Encounter for examination of blood pressure without abnormal findings: Secondary | ICD-10-CM

## 2020-03-07 DIAGNOSIS — Z3A38 38 weeks gestation of pregnancy: Secondary | ICD-10-CM | POA: Diagnosis not present

## 2020-03-07 DIAGNOSIS — O133 Gestational [pregnancy-induced] hypertension without significant proteinuria, third trimester: Secondary | ICD-10-CM | POA: Diagnosis present

## 2020-03-07 DIAGNOSIS — D62 Acute posthemorrhagic anemia: Secondary | ICD-10-CM | POA: Diagnosis not present

## 2020-03-07 DIAGNOSIS — Z3480 Encounter for supervision of other normal pregnancy, unspecified trimester: Secondary | ICD-10-CM

## 2020-03-07 DIAGNOSIS — O134 Gestational [pregnancy-induced] hypertension without significant proteinuria, complicating childbirth: Principal | ICD-10-CM | POA: Diagnosis present

## 2020-03-07 LAB — COMPREHENSIVE METABOLIC PANEL
ALT: 5 IU/L (ref 0–32)
ALT: 8 U/L (ref 0–44)
AST: 18 IU/L (ref 0–40)
AST: 19 U/L (ref 15–41)
Albumin/Globulin Ratio: 1.3 (ref 1.2–2.2)
Albumin: 4 g/dL (ref 3.9–5.0)
Albumin: 2.8 g/dL — ABNORMAL LOW (ref 3.5–5.0)
Alkaline Phosphatase: 113 IU/L — ABNORMAL HIGH (ref 42–106)
Alkaline Phosphatase: 89 U/L (ref 38–126)
Anion gap: 10 (ref 5–15)
BUN/Creatinine Ratio: 7 — ABNORMAL LOW (ref 9–23)
BUN: 6 mg/dL (ref 6–20)
BUN: <5 mg/dL — ABNORMAL LOW (ref 6–20)
Bilirubin Total: 0.4 mg/dL (ref 0.0–1.2)
CO2: 24 mmol/L (ref 20–29)
CO2: 20 mmol/L — ABNORMAL LOW (ref 22–32)
Calcium: 9.2 mg/dL (ref 8.7–10.2)
Calcium: 8.5 mg/dL — ABNORMAL LOW (ref 8.9–10.3)
Chloride: 104 mmol/L (ref 96–106)
Chloride: 109 mmol/L (ref 98–111)
Creatinine, Ser: 0.92 mg/dL (ref 0.57–1.00)
Creatinine, Ser: 0.87 mg/dL (ref 0.44–1.00)
GFR calc Af Amer: 104 mL/min/{1.73_m2} (ref >59)
GFR calc Af Amer: >60 mL/min (ref >60)
GFR calc non Af Amer: 90 mL/min/{1.73_m2} (ref >59)
GFR calc non Af Amer: >60 mL/min (ref >60)
Globulin, Total: 3.1 g/dL (ref 1.5–4.5)
Glucose, Bld: 77 mg/dL (ref 70–99)
Glucose: 79 mg/dL (ref 65–99)
Potassium: 4.3 mmol/L (ref 3.5–5.2)
Potassium: 3.7 mmol/L (ref 3.5–5.1)
Sodium: 141 mmol/L (ref 134–144)
Sodium: 139 mmol/L (ref 135–145)
Total Bilirubin: 0.6 mg/dL (ref 0.3–1.2)
Total Protein: 7.1 g/dL (ref 6.0–8.5)
Total Protein: 6.6 g/dL (ref 6.5–8.1)

## 2020-03-07 LAB — CBC
HCT: 30 % — ABNORMAL LOW (ref 36.0–46.0)
Hematocrit: 31.2 % — ABNORMAL LOW (ref 34.0–46.6)
Hemoglobin: 10.8 g/dL — ABNORMAL LOW (ref 11.1–15.9)
Hemoglobin: 10 g/dL — ABNORMAL LOW (ref 12.0–15.0)
MCH: 31.6 pg (ref 26.6–33.0)
MCH: 31 pg (ref 26.0–34.0)
MCHC: 34.6 g/dL (ref 31.5–35.7)
MCHC: 33.3 g/dL (ref 30.0–36.0)
MCV: 91 fL (ref 79–97)
MCV: 92.9 fL (ref 80.0–100.0)
Platelets: 185 10*3/uL (ref 150–450)
Platelets: 174 10*3/uL (ref 150–400)
RBC: 3.42 x10E6/uL — ABNORMAL LOW (ref 3.77–5.28)
RBC: 3.23 MIL/uL — ABNORMAL LOW (ref 3.87–5.11)
RDW: 12.5 % (ref 11.7–15.4)
RDW: 12.8 % (ref 11.5–15.5)
WBC: 6.2 10*3/uL (ref 3.4–10.8)
WBC: 6.8 10*3/uL (ref 4.0–10.5)
nRBC: 0 % (ref 0.0–0.2)

## 2020-03-07 LAB — PROTEIN / CREATININE RATIO, URINE
Creatinine, Urine: 127.6 mg/dL
Creatinine, Urine: 86.85 mg/dL
Protein Creatinine Ratio: 0.23 mg/mg{Cre} — ABNORMAL HIGH (ref 0.00–0.15)
Protein, Ur: 37.9 mg/dL
Protein/Creat Ratio: 297 mg/g creat — ABNORMAL HIGH (ref 0–200)
Total Protein, Urine: 20 mg/dL

## 2020-03-07 LAB — RESPIRATORY PANEL BY RT PCR (FLU A&B, COVID)
Influenza A by PCR: NEGATIVE
Influenza B by PCR: NEGATIVE
SARS Coronavirus 2 by RT PCR: NEGATIVE

## 2020-03-07 SURGERY — Surgical Case
Anesthesia: Spinal

## 2020-03-07 MED ORDER — TERBUTALINE SULFATE 1 MG/ML IJ SOLN
0.2500 mg | Freq: Once | INTRAMUSCULAR | Status: AC | PRN
  Administered 2020-03-07: 0.25 mg via SUBCUTANEOUS
  Filled 2020-03-07: qty 1

## 2020-03-07 MED ORDER — ONDANSETRON HCL 4 MG/2ML IJ SOLN
INTRAMUSCULAR | Status: AC
  Filled 2020-03-07: qty 2

## 2020-03-07 MED ORDER — OXYTOCIN-SODIUM CHLORIDE 30-0.9 UT/500ML-% IV SOLN: 2.5000 [IU]/h | INTRAVENOUS | Status: AC

## 2020-03-07 MED ORDER — PHENYLEPHRINE HCL-NACL 20-0.9 MG/250ML-% IV SOLN
INTRAVENOUS | Status: AC
  Filled 2020-03-07: qty 250

## 2020-03-07 MED ORDER — FENTANYL CITRATE (PF) 100 MCG/2ML IJ SOLN: 25.0000 ug | INTRAMUSCULAR | Status: DC | PRN

## 2020-03-07 MED ORDER — GABAPENTIN 100 MG PO CAPS
300.0000 mg | ORAL_CAPSULE | Freq: Two times a day (BID) | ORAL | Status: DC
  Administered 2020-03-07 – 2020-03-10 (×6): 300 mg via ORAL
  Filled 2020-03-07 (×6): qty 3

## 2020-03-07 MED ORDER — ONDANSETRON HCL 4 MG/2ML IJ SOLN
INTRAMUSCULAR | Status: DC | PRN
  Administered 2020-03-07: 4 mg via INTRAVENOUS

## 2020-03-07 MED ORDER — ZOLPIDEM TARTRATE 5 MG PO TABS: 5.0000 mg | ORAL_TABLET | Freq: Every evening | ORAL | Status: DC | PRN

## 2020-03-07 MED ORDER — IBUPROFEN 800 MG PO TABS
800.0000 mg | ORAL_TABLET | Freq: Three times a day (TID) | ORAL | Status: DC
  Administered 2020-03-08 – 2020-03-10 (×7): 800 mg via ORAL
  Filled 2020-03-07 (×7): qty 1

## 2020-03-07 MED ORDER — WITCH HAZEL-GLYCERIN EX PADS: 1.0000 "application " | MEDICATED_PAD | CUTANEOUS | Status: DC | PRN

## 2020-03-07 MED ORDER — ACETAMINOPHEN 160 MG/5ML PO SOLN: 325.0000 mg | ORAL | Status: DC | PRN

## 2020-03-07 MED ORDER — OXYTOCIN BOLUS FROM INFUSION: 333.0000 mL | Freq: Once | INTRAVENOUS | Status: DC

## 2020-03-07 MED ORDER — OXYCODONE HCL 5 MG PO TABS
5.0000 mg | ORAL_TABLET | ORAL | Status: DC | PRN
  Administered 2020-03-08 – 2020-03-10 (×4): 10 mg via ORAL
  Filled 2020-03-07 (×3): qty 2
  Filled 2020-03-07: qty 1
  Filled 2020-03-07: qty 2

## 2020-03-07 MED ORDER — DIBUCAINE (PERIANAL) 1 % EX OINT: 1.0000 "application " | TOPICAL_OINTMENT | CUTANEOUS | Status: DC | PRN

## 2020-03-07 MED ORDER — OXYCODONE-ACETAMINOPHEN 5-325 MG PO TABS: 2.0000 | ORAL_TABLET | ORAL | Status: DC | PRN

## 2020-03-07 MED ORDER — PRENATAL MULTIVITAMIN CH
1.0000 | ORAL_TABLET | Freq: Every day | ORAL | Status: DC
  Administered 2020-03-08 – 2020-03-10 (×3): 1 via ORAL
  Filled 2020-03-07 (×3): qty 1

## 2020-03-07 MED ORDER — OXYTOCIN-SODIUM CHLORIDE 30-0.9 UT/500ML-% IV SOLN
INTRAVENOUS | Status: AC
  Filled 2020-03-07: qty 1000

## 2020-03-07 MED ORDER — LACTATED RINGERS IV SOLN: INTRAVENOUS | Status: DC

## 2020-03-07 MED ORDER — FENTANYL CITRATE (PF) 100 MCG/2ML IJ SOLN
INTRAMUSCULAR | Status: AC
  Filled 2020-03-07: qty 2

## 2020-03-07 MED ORDER — SIMETHICONE 80 MG PO CHEW
80.0000 mg | CHEWABLE_TABLET | Freq: Three times a day (TID) | ORAL | Status: DC
  Administered 2020-03-07 – 2020-03-10 (×6): 80 mg via ORAL
  Filled 2020-03-07 (×7): qty 1

## 2020-03-07 MED ORDER — CEFAZOLIN SODIUM-DEXTROSE 2-3 GM-%(50ML) IV SOLR
INTRAVENOUS | Status: DC | PRN
  Administered 2020-03-07: 2 g via INTRAVENOUS

## 2020-03-07 MED ORDER — NALBUPHINE HCL 10 MG/ML IJ SOLN
INTRAMUSCULAR | Status: AC
  Filled 2020-03-07: qty 1

## 2020-03-07 MED ORDER — SENNOSIDES-DOCUSATE SODIUM 8.6-50 MG PO TABS
2.0000 | ORAL_TABLET | ORAL | Status: DC
  Administered 2020-03-07 – 2020-03-09 (×3): 2 via ORAL
  Filled 2020-03-07 (×3): qty 2

## 2020-03-07 MED ORDER — OXYCODONE HCL 5 MG PO TABS: 5.0000 mg | ORAL_TABLET | Freq: Once | ORAL | Status: DC | PRN

## 2020-03-07 MED ORDER — ACETAMINOPHEN 325 MG PO TABS: 650.0000 mg | ORAL_TABLET | ORAL | Status: DC | PRN

## 2020-03-07 MED ORDER — OXYCODONE HCL 5 MG/5ML PO SOLN: 5.0000 mg | Freq: Once | ORAL | Status: DC | PRN

## 2020-03-07 MED ORDER — SIMETHICONE 80 MG PO CHEW
80.0000 mg | CHEWABLE_TABLET | ORAL | Status: DC | PRN
  Administered 2020-03-08: 80 mg via ORAL

## 2020-03-07 MED ORDER — TETANUS-DIPHTH-ACELL PERTUSSIS 5-2.5-18.5 LF-MCG/0.5 IM SUSP: 0.5000 mL | Freq: Once | INTRAMUSCULAR | Status: DC

## 2020-03-07 MED ORDER — NALBUPHINE HCL 10 MG/ML IJ SOLN
5.0000 mg | Freq: Once | INTRAMUSCULAR | Status: AC
  Administered 2020-03-07: 5 mg via INTRAVENOUS

## 2020-03-07 MED ORDER — CEFAZOLIN SODIUM-DEXTROSE 2-4 GM/100ML-% IV SOLN
INTRAVENOUS | Status: AC
  Filled 2020-03-07: qty 100

## 2020-03-07 MED ORDER — FENTANYL CITRATE (PF) 100 MCG/2ML IJ SOLN
INTRAMUSCULAR | Status: DC | PRN
  Administered 2020-03-07: 15 ug via INTRATHECAL

## 2020-03-07 MED ORDER — FLEET ENEMA 7-19 GM/118ML RE ENEM: 1.0000 | ENEMA | RECTAL | Status: DC | PRN

## 2020-03-07 MED ORDER — BUPIVACAINE HCL (PF) 0.5 % IJ SOLN
INTRAMUSCULAR | Status: DC | PRN
  Administered 2020-03-07: 30 mL

## 2020-03-07 MED ORDER — ACETAMINOPHEN 325 MG PO TABS: 325.0000 mg | ORAL_TABLET | ORAL | Status: DC | PRN

## 2020-03-07 MED ORDER — SODIUM CHLORIDE 0.9 % IR SOLN
Status: DC | PRN
  Administered 2020-03-07: 1000 mL

## 2020-03-07 MED ORDER — OXYTOCIN-SODIUM CHLORIDE 30-0.9 UT/500ML-% IV SOLN
INTRAVENOUS | Status: DC | PRN
  Administered 2020-03-07: 200 mL via INTRAVENOUS

## 2020-03-07 MED ORDER — BUPIVACAINE HCL (PF) 0.5 % IJ SOLN
INTRAMUSCULAR | Status: AC
  Filled 2020-03-07: qty 30

## 2020-03-07 MED ORDER — MISOPROSTOL 50MCG HALF TABLET
50.0000 ug | ORAL_TABLET | Freq: Once | ORAL | Status: DC
  Filled 2020-03-07: qty 1

## 2020-03-07 MED ORDER — KETOROLAC TROMETHAMINE 30 MG/ML IJ SOLN
30.0000 mg | Freq: Once | INTRAMUSCULAR | Status: AC
  Administered 2020-03-07: 30 mg via INTRAVENOUS
  Filled 2020-03-07: qty 1

## 2020-03-07 MED ORDER — DEXAMETHASONE SODIUM PHOSPHATE 4 MG/ML IJ SOLN
INTRAMUSCULAR | Status: DC | PRN
  Administered 2020-03-07: 4 mg via INTRAVENOUS

## 2020-03-07 MED ORDER — PHENYLEPHRINE HCL-NACL 20-0.9 MG/250ML-% IV SOLN
INTRAVENOUS | Status: DC | PRN
  Administered 2020-03-07: 60 ug/min via INTRAVENOUS

## 2020-03-07 MED ORDER — SOD CITRATE-CITRIC ACID 500-334 MG/5ML PO SOLN
30.0000 mL | ORAL | Status: DC | PRN
  Administered 2020-03-07: 30 mL via ORAL
  Filled 2020-03-07: qty 15

## 2020-03-07 MED ORDER — MORPHINE SULFATE (PF) 0.5 MG/ML IJ SOLN
INTRAMUSCULAR | Status: AC
  Filled 2020-03-07: qty 10

## 2020-03-07 MED ORDER — OXYTOCIN-SODIUM CHLORIDE 30-0.9 UT/500ML-% IV SOLN: 2.5000 [IU]/h | INTRAVENOUS | Status: DC

## 2020-03-07 MED ORDER — LACTATED RINGERS IV SOLN: 500.0000 mL | INTRAVENOUS | Status: DC | PRN

## 2020-03-07 MED ORDER — COCONUT OIL OIL: 1.0000 "application " | TOPICAL_OIL | Status: DC | PRN

## 2020-03-07 MED ORDER — OXYCODONE-ACETAMINOPHEN 5-325 MG PO TABS: 1.0000 | ORAL_TABLET | ORAL | Status: DC | PRN

## 2020-03-07 MED ORDER — ONDANSETRON HCL 4 MG/2ML IJ SOLN: 4.0000 mg | Freq: Once | INTRAMUSCULAR | Status: DC | PRN

## 2020-03-07 MED ORDER — BUPIVACAINE IN DEXTROSE 0.75-8.25 % IT SOLN
INTRATHECAL | Status: DC | PRN
  Administered 2020-03-07: 1.65 mL via INTRATHECAL

## 2020-03-07 MED ORDER — ONDANSETRON HCL 4 MG/2ML IJ SOLN: 4.0000 mg | Freq: Four times a day (QID) | INTRAMUSCULAR | Status: DC | PRN

## 2020-03-07 MED ORDER — LIDOCAINE HCL (PF) 1 % IJ SOLN: 30.0000 mL | INTRAMUSCULAR | Status: DC | PRN

## 2020-03-07 MED ORDER — MEPERIDINE HCL 25 MG/ML IJ SOLN: 6.2500 mg | INTRAMUSCULAR | Status: DC | PRN

## 2020-03-07 MED ORDER — MENTHOL 3 MG MT LOZG: 1.0000 | LOZENGE | OROMUCOSAL | Status: DC | PRN

## 2020-03-07 MED ORDER — ENOXAPARIN SODIUM 40 MG/0.4ML ~~LOC~~ SOLN
40.0000 mg | SUBCUTANEOUS | Status: DC
  Administered 2020-03-08 – 2020-03-10 (×3): 40 mg via SUBCUTANEOUS
  Filled 2020-03-07 (×3): qty 0.4

## 2020-03-07 MED ORDER — LACTATED RINGERS IV SOLN: INTRAVENOUS | Status: DC | PRN

## 2020-03-07 MED ORDER — SIMETHICONE 80 MG PO CHEW
80.0000 mg | CHEWABLE_TABLET | ORAL | Status: DC
  Administered 2020-03-08 – 2020-03-09 (×2): 80 mg via ORAL
  Filled 2020-03-07 (×3): qty 1

## 2020-03-07 MED ORDER — DIPHENHYDRAMINE HCL 25 MG PO CAPS
25.0000 mg | ORAL_CAPSULE | Freq: Four times a day (QID) | ORAL | Status: DC | PRN
  Administered 2020-03-07 – 2020-03-08 (×2): 25 mg via ORAL
  Filled 2020-03-07 (×2): qty 1

## 2020-03-07 MED ORDER — MORPHINE SULFATE (PF) 0.5 MG/ML IJ SOLN
INTRAMUSCULAR | Status: DC | PRN
Start: 2020-03-07 — End: 2020-03-10
  Administered 2020-03-07: 150 ug via INTRATHECAL

## 2020-03-07 MED ORDER — ACETAMINOPHEN 500 MG PO TABS
1000.0000 mg | ORAL_TABLET | Freq: Four times a day (QID) | ORAL | Status: DC
  Administered 2020-03-07 – 2020-03-10 (×10): 1000 mg via ORAL
  Filled 2020-03-07 (×11): qty 2

## 2020-03-07 SURGICAL SUPPLY — 33 items
BARRIER ADHS 3X4 INTERCEED (GAUZE/BANDAGES/DRESSINGS) IMPLANT
BENZOIN TINCTURE PRP APPL 2/3 (GAUZE/BANDAGES/DRESSINGS) ×3 IMPLANT
CHLORAPREP W/TINT 26ML (MISCELLANEOUS) ×3 IMPLANT
CLAMP CORD UMBIL (MISCELLANEOUS) IMPLANT
CLOSURE WOUND 1/2 X4 (GAUZE/BANDAGES/DRESSINGS) ×1
CLOTH BEACON ORANGE TIMEOUT ST (SAFETY) ×3 IMPLANT
DRSG OPSITE POSTOP 4X10 (GAUZE/BANDAGES/DRESSINGS) ×3 IMPLANT
ELECT REM PT RETURN 9FT ADLT (ELECTROSURGICAL) ×3
ELECTRODE REM PT RTRN 9FT ADLT (ELECTROSURGICAL) ×1 IMPLANT
EXTRACTOR VACUUM KIWI (MISCELLANEOUS) IMPLANT
GLOVE BIO SURGEON STRL SZ 6.5 (GLOVE) ×2 IMPLANT
GLOVE BIO SURGEONS STRL SZ 6.5 (GLOVE) ×1
GLOVE BIOGEL PI IND STRL 7.0 (GLOVE) ×2 IMPLANT
GLOVE BIOGEL PI INDICATOR 7.0 (GLOVE) ×4
GOWN STRL REUS W/TWL LRG LVL3 (GOWN DISPOSABLE) ×6 IMPLANT
KIT ABG SYR 3ML LUER SLIP (SYRINGE) IMPLANT
NEEDLE HYPO 22GX1.5 SAFETY (NEEDLE) IMPLANT
NEEDLE HYPO 25X5/8 SAFETYGLIDE (NEEDLE) IMPLANT
NS IRRIG 1000ML POUR BTL (IV SOLUTION) ×3 IMPLANT
PACK C SECTION WH (CUSTOM PROCEDURE TRAY) ×3 IMPLANT
PAD OB MATERNITY 4.3X12.25 (PERSONAL CARE ITEMS) ×3 IMPLANT
PENCIL SMOKE EVAC W/HOLSTER (ELECTROSURGICAL) ×3 IMPLANT
RETRACTOR WND ALEXIS 25 LRG (MISCELLANEOUS) IMPLANT
RTRCTR WOUND ALEXIS 25CM LRG (MISCELLANEOUS)
STRIP CLOSURE SKIN 1/2X4 (GAUZE/BANDAGES/DRESSINGS) ×2 IMPLANT
SUT VIC AB 0 CT1 36 (SUTURE) ×18 IMPLANT
SUT VIC AB 2-0 CT1 27 (SUTURE) ×3
SUT VIC AB 2-0 CT1 TAPERPNT 27 (SUTURE) ×1 IMPLANT
SUT VIC AB 4-0 PS2 27 (SUTURE) ×3 IMPLANT
SYR CONTROL 10ML LL (SYRINGE) IMPLANT
TOWEL OR 17X24 6PK STRL BLUE (TOWEL DISPOSABLE) ×3 IMPLANT
TRAY FOLEY W/BAG SLVR 14FR LF (SET/KITS/TRAYS/PACK) IMPLANT
WATER STERILE IRR 1000ML POUR (IV SOLUTION) ×3 IMPLANT
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
## 2020-03-07 NOTE — Anesthesia Preprocedure Evaluation (Signed)
Anesthesia Evaluation  Patient identified by MRN, date of birth, ID band Patient awake    Reviewed: Allergy & Precautions, H&P , NPO status , Patient's Chart, lab work & pertinent test results, reviewed documented beta blocker date and time   Airway Mallampati: I  TM Distance: >3 FB Neck ROM: full    Dental no notable dental hx. (+) Teeth Intact, Dental Advisory Given   Pulmonary neg pulmonary ROS, former smoker,    Pulmonary exam normal breath sounds clear to auscultation       Cardiovascular hypertension, Normal cardiovascular exam Rhythm:regular Rate:Normal     Neuro/Psych PSYCHIATRIC DISORDERS Depression negative neurological ROS     GI/Hepatic negative GI ROS, Neg liver ROS,   Endo/Other  negative endocrine ROS  Renal/GU negative Renal ROS  negative genitourinary   Musculoskeletal   Abdominal   Peds  Hematology negative hematology ROS (+)   Anesthesia Other Findings   Reproductive/Obstetrics (+) Pregnancy                             Anesthesia Physical Anesthesia Plan  ASA: II and emergent  Anesthesia Plan: Spinal   Post-op Pain Management:    Induction:   PONV Risk Score and Plan: 2  Airway Management Planned: Natural Airway  Additional Equipment:   Intra-op Plan:   Post-operative Plan:   Informed Consent: I have reviewed the patients History and Physical, chart, labs and discussed the procedure including the risks, benefits and alternatives for the proposed anesthesia with the patient or authorized representative who has indicated his/her understanding and acceptance.       Plan Discussed with: Anesthesiologist  Anesthesia Plan Comments:         Anesthesia Quick Evaluation

## 2020-03-07 NOTE — Op Note (Signed)
[facility] PROCEDURE DATE: 03/07/2020  PREOPERATIVE DIAGNOSES: Intrauterine pregnancy at [redacted]w[redacted]d weeks gestation; malpresentation: frank breech s/p unsuccessful ECV  POSTOPERATIVE DIAGNOSES: The same  PROCEDURE: Primary Low Transverse Cesarean Section  SURGEON:  Dr. Scheryl Darter   Dr. Lynnda Shields  ANESTHESIOLOGY TEAM: Anesthesiologist: Bethena Midget, MD CRNA: Rhymer, Doree Fudge, CRNA  INDICATIONS: Bethany Decker is a 19 y.o. G2P0010 at [redacted]w[redacted]d here for cesarean section secondary to the indications listed under preoperative diagnoses; please see preoperative note for further details.  The risks of surgery were discussed with the patient including but were not limited to: bleeding which may require transfusion or reoperation; infection which may require antibiotics; injury to bowel, bladder, ureters or other surrounding organs; injury to the fetus; need for additional procedures including hysterectomy in the event of a life-threatening hemorrhage; formation of adhesions; placental abnormalities wth subsequent pregnancies; incisional problems; thromboembolic phenomenon and other postoperative/anesthesia complications.  The patient concurred with the proposed plan, giving informed written consent for the procedure.    FINDINGS:  Viable female infant in cephalic presentation.  Apgars 8 and 8.  Clear amniotic fluid.  Intact placenta, three vessel cord.  Normal uterus, fallopian tubes and ovaries bilaterally.  ANESTHESIA: Spinal INTRAVENOUS FLUIDS: 900 ml   ESTIMATED BLOOD LOSS: 173 ml URINE OUTPUT:  200 ml SPECIMENS: Placenta sent to L&D COMPLICATIONS: None immediate  PROCEDURE IN DETAIL:  The patient preoperatively received intravenous antibiotics and had sequential compression devices applied to her lower extremities.  She was then taken to the operating room where spinal anesthesia was administered and was found to be adequate. She was then placed in a dorsal supine position with a  leftward tilt, and prepped and draped in a sterile manner.  A foley catheter was placed into her bladder and attached to constant gravity.  After an adequate timeout was performed, a Pfannenstiel skin incision was made with scalpel and carried through to the underlying layer of fascia. The fascia was incised in the midline, and this incision was extended bilaterally using the Mayo scissors.  Kocher clamps were applied to the superior aspect of the fascial incision and the underlying rectus muscles were dissected off bluntly and sharply.  A similar process was carried out on the inferior aspect of the fascial incision. The rectus muscles were separated in the midline and the peritoneum was entered bluntly. The Alexis self-retaining retractor was introduced into the abdominal cavity.  Attention was turned to the lower uterine segment. A bladder flap was created sharply and bluntly. A low transverse hysterotomy was then made with a scalpel and extended bilaterally bluntly.  The infant was successfully delivered using breech maneuvers, the cord was clamped and cut after one minute, and the infant was handed over to the awaiting neonatology team. Uterine massage was then administered, and the placenta delivered intact with a three-vessel cord. The uterus was then cleared of clots and debris.  The hysterotomy was closed with 0 Vicryl in a running locked fashion, and an imbricating layer was also placed with 0 Vicryl. The pelvis was cleared of all clot and debris. Hemostasis was confirmed on all surfaces.  The retractor was removed.  The peritoneum was closed with a 0 Vicryl running stitch. The fascia was then closed using 0 Vicryl in a running fashion.  The subcutaneous layer was irrigated, and the skin was closed with a 4-0 Vicryl subcuticular stitch. The patient tolerated the procedure well. Sponge, instrument and needle counts were correct x 3.  She was taken to the recovery  room in stable condition.   Sheila Oats, MD OB Fellow, [facility] 03/07/2020 5:45 PM

## 2020-03-07 NOTE — Discharge Summary (Signed)
Postpartum Discharge Summary      Patient Name: Bethany Decker DOB: 18-Mar-2001 MRN: [id number]  Date of admission: 03/07/2020 Delivery date:03/07/2020  Delivering provider: [place] Mode  Date of discharge: 03/10/2020  Admitting diagnosis: Gestational HTN, third trimester [O13.3] Intrauterine pregnancy: [redacted]w[redacted]d     Secondary diagnosis:  Principal Problem:   Cesarean delivery delivered Active Problems:   Foster care (status)   MDD (major depressive disorder)   Gestational HTN, third trimester   Breech presentation  Additional problems: as noted above   Discharge diagnosis: Cesarean delivery delivered                                            Post partum procedures:None Augmentation: N/A Complications: None  Hospital course: Sceduled C/S   19 y.o. yo G2P0010 at [redacted]w[redacted]d was admitted to the hospital 03/07/2020 for IOL secondary to [place] but given breech presentation s/p unsuccessful ECV, pt was consented for Cesarean delivery. Delivery details are as follows:  Membrane Rupture Time/Date: 4:54 PM ,03/07/2020   Delivery Method:C-Section, Low Transverse  Details of operation can be found in separate operative note.  Patient had an uncomplicated postpartum course.  She is ambulating, tolerating a regular diet, passing flatus, and urinating well. Patient is discharged home in stable condition on  03/10/20        Newborn Data: Birth date:03/07/2020  Birth time:4:54 PM  Gender:Female  Living status:Living  Apgars:8 ,8  Weight:7 lb 6.9 oz (3.37 kg)     Magnesium Sulfate received: No BMZ received: No Rhophylac:N/A MMR:N/A T-DaP:declined in prenatal period  Flu: No Transfusion:No  Physical exam  Vitals:   03/09/20 0909 03/09/20 1259 03/09/20 1946 03/10/20 0519  BP: 113/83 121/86 135/89 114/65  Pulse: 62 97 79 76  Resp:  $Remo'18 16 17  'hxghC$ Temp:  97.7 F (36.5 C) 98.7 F (37.1 C) 98.3 F (36.8 C)  TempSrc:  Oral Oral Oral  SpO2:   100% 100%  Weight:      Height:       General:  alert, cooperative and no distress Lochia: appropriate Uterine Fundus: firm Incision: Healing well with no significant drainage, No significant erythema, Dressing is clean, dry, and intact DVT Evaluation: No evidence of DVT seen on physical exam. Labs: Lab Results  Component Value Date   WBC 11.4 (H) 03/08/2020   HGB 9.2 (L) 03/08/2020   HCT 26.7 (L) 03/08/2020   MCV 94.0 03/08/2020   PLT 158 03/08/2020   CMP Latest Ref Rng & Units 03/07/2020  Glucose 70 - 99 mg/dL 77  BUN 6 - 20 mg/dL <5(L)  Creatinine 0.44 - 1.00 mg/dL 0.87  Sodium 135 - 145 mmol/L 139  Potassium 3.5 - 5.1 mmol/L 3.7  Chloride 98 - 111 mmol/L 109  CO2 22 - 32 mmol/L 20(L)  Calcium 8.9 - 10.3 mg/dL 8.5(L)  Total Protein 6.5 - 8.1 g/dL 6.6  Total Bilirubin 0.3 - 1.2 mg/dL 0.6  Alkaline Phos 38 - 126 U/L 89  AST 15 - 41 U/L 19  ALT 0 - 44 U/L 8   Edinburgh Score: Edinburgh Postnatal Depression Scale Screening Tool 03/09/2020  I have been able to laugh and see the funny side of things. 0  I have looked forward with enjoyment to things. 0  I have blamed myself unnecessarily when things went wrong. 2  I have been anxious or worried for no good reason.  1  I have felt scared or panicky for no good reason. 0  Things have been getting on top of me. 1  I have been so unhappy that I have had difficulty sleeping. 0  I have felt sad or miserable. 1  I have been so unhappy that I have been crying. 1  The thought of harming myself has occurred to me. 0  Edinburgh Postnatal Depression Scale Total 6     After visit meds:  Allergies as of 03/10/2020   No Known Allergies     Medication List    STOP taking these medications   Blood Pressure Monitor [place] Supp Sm Misc   [place] Weight Scale Misc   menthol-cetylpyridinium 3 MG lozenge Commonly known as: CEPACOL   metroNIDAZOLE 500 MG tablet Commonly known as: FLAGYL   polyethylene glycol 17 g packet Commonly known as: MiraLax      TAKE these medications   ascorbic acid 500 MG tablet Commonly known as: VITAMIN C Take 1 tablet one a day by mouth every other day with an Iron tablet.   docusate sodium 100 MG capsule Commonly known as: COLACE Take 1 capsule (100 mg total) by mouth 2 (two) times daily as needed.   ferrous sulfate 325 (65 FE) MG tablet Commonly known as: FerrouSul Take 1 tablet by mouth twice a day every other day with Vitamin C tablet.   hydrOXYzine 25 MG tablet Commonly known as: ATARAX/VISTARIL Take 1 tablet (25 mg total) by mouth 3 (three) times daily as needed for anxiety.   ibuprofen 800 MG tablet Commonly known as: ADVIL Take 1 tablet (800 mg total) by mouth every 8 (eight) hours.   multivitamin-prenatal 27-0.8 MG Tabs tablet Take 1 tablet by mouth daily at 12 noon.   oxyCODONE 5 MG immediate release tablet Commonly known as: Oxy IR/ROXICODONE Take 1 tablet (5 mg total) by mouth every 4 (four) hours as needed for up to 2 days for moderate pain.   senna-docusate 8.6-50 MG tablet Commonly known as: Senokot-S Take 2 tablets by mouth daily. Start taking on: March 11, 2020   simethicone 80 MG chewable tablet Commonly known as: MYLICON Chew 1 tablet (80 mg total) by mouth as needed for flatulence.        Discharge home in stable condition Infant Feeding: Breast Infant Disposition:NICU Discharge instruction: per After Visit Summary and Postpartum booklet. Activity: Advance as tolerated. Pelvic rest for 6 weeks.  Diet: routine diet Future Appointments: Future Appointments  Date Time Provider [place]  03/19/2020  1:20 PM [place] None  03/19/2020  1:45 PM Lynnea Ferrier, LCSW [facility] None  04/16/2020  2:50 PM Laury Deep, CNM [facility] None   Follow up Visit:  Follow-up Information    [place]. Go in 1 week(s).   Specialty: Obstetrics and Gynecology Why: for incision check and 4wks for postpartum visit Contact  information: [place] [phone]             Message sent to [facility] on 03/07/20.  Please schedule this patient for a In person postpartum visit in 4 weeks with the following provider: Any provider. Additional Postpartum F/U:Postpartum Depression checkup in 1 week, Incision check 1 week and BP check 2-3 days  High risk pregnancy complicated by: gHTN, Breech presentation, MDD with h/o intentional overdose Delivery mode:  C-Section, Low Transverse  Anticipated Birth Control:  Depo  Gaylan Gerold, CNM,  MSN, IBCLC 03/10/20 10:00 AM

## 2020-03-07 NOTE — Anesthesia Procedure Notes (Signed)
Spinal  Patient location during procedure: OR Start time: 03/07/2020 4:20 PM End time: 03/07/2020 4:27 PM Staffing Anesthesiologist: Bethena Midget, MD Preanesthetic Checklist Completed: patient identified, IV checked, site marked, risks and benefits discussed, surgical consent, monitors and equipment checked, pre-op evaluation and timeout performed Spinal Block Patient position: sitting Prep: DuraPrep Patient monitoring: heart rate, cardiac monitor, continuous pulse ox and blood pressure Approach: midline Location: L3-4 Injection technique: single-shot Needle Needle type: Sprotte  Needle gauge: 24 G Needle length: 9 cm Assessment Sensory level: T4

## 2020-03-07 NOTE — Progress Notes (Signed)
   Subjective:  Bethany Decker is a 19 y.o. female here for BP check.   Hypertension ROS: no TIA's, no chest pain on exertion, no dyspnea on exertion, no swelling of ankles, no orthostatic dizziness or lightheadedness, no orthopnea or paroxysmal nocturnal dyspnea and no palpitations.    Objective:  LMP 05/14/2019   Appearance alert, well appearing, and in no distress, oriented to person, place, and time and normal appearing weight.  General exam BP noted to be well controlled today in office.    Vitals:   03/07/20 0941  BP: (!) 135/93  Pulse: 61  Temp: 98.1 F (36.7 C)  Height: 5\' 6"  (1.676 m)  Weight: 161 lb 12.8 oz (73.4 kg)  TempSrc: Oral  BMI (Calculated): 26.13    Assessment:   Blood Pressure asymptomatic, needs further observation and needs improvement.   Plan:  Patient being sent to Labor and Delivery as direct admit by , CNM.  Gerrit Heck, RN

## 2020-03-08 LAB — CBC
HCT: 26.7 % — ABNORMAL LOW (ref 36.0–46.0)
Hemoglobin: 9.2 g/dL — ABNORMAL LOW (ref 12.0–15.0)
MCH: 32.4 pg (ref 26.0–34.0)
MCHC: 34.5 g/dL (ref 30.0–36.0)
MCV: 94 fL (ref 80.0–100.0)
Platelets: 158 10*3/uL (ref 150–400)
RBC: 2.84 MIL/uL — ABNORMAL LOW (ref 3.87–5.11)
RDW: 12.7 % (ref 11.5–15.5)
WBC: 11.4 10*3/uL — ABNORMAL HIGH (ref 4.0–10.5)
nRBC: 0 % (ref 0.0–0.2)

## 2020-03-08 LAB — RPR: RPR Ser Ql: NONREACTIVE

## 2020-03-08 MED ORDER — HYDROXYZINE HCL 25 MG PO TABS: 25.0000 mg | ORAL_TABLET | Freq: Three times a day (TID) | ORAL | Status: DC | PRN

## 2020-03-08 MED ORDER — NIFEDIPINE ER OSMOTIC RELEASE 30 MG PO TB24
30.0000 mg | ORAL_TABLET | Freq: Every day | ORAL | Status: DC
  Administered 2020-03-08 – 2020-03-10 (×3): 30 mg via ORAL
  Filled 2020-03-08 (×3): qty 1

## 2020-03-08 NOTE — Progress Notes (Signed)
Discussed pumping and hand expression with patient. Pt demonstrated hand expression, several drops of colostrum collected. DEBP set up and instructed patent how to use. Patient pumping at this time.   Tylene Fantasia, RN

## 2020-03-08 NOTE — Progress Notes (Addendum)
Subjective: Postpartum Day 1: Cesarean Delivery Patient reports tolerating PO and no problems voiding. Has not been having much incisional pain, up and walking around. Complains of itching.      Objective: Vital signs in last 24 hours: Temp:  [97 F (36.1 C)-98.7 F (37.1 C)] 98.2 F (36.8 C) (09/25 0635) Pulse Rate:  [43-68] 68 (09/25 0635) Resp:  [14-20] 16 (09/25 0635) BP: (117-140)/(68-97) 129/68 (09/25 0635) SpO2:  [98 %-100 %] 100 % (09/25 0635) Weight:  [73 kg-73.4 kg] 73 kg (09/24 1118)  Physical Exam:  General: alert, cooperative, appears stated age and no distress Lochia: appropriate Uterine Fundus: firm Incision: Steri-strips with some blood. Honeycomb 50% saturated this morning. Pressure dressing on DVT Evaluation: No evidence of DVT seen on physical exam. No significant calf/ankle edema.  Recent Labs    03/07/20 1121 03/08/20 0404  HGB 10.0* 9.2*  HCT 30.0* 26.7*    Assessment/Plan: POD#1 from post Cesarean section for breech presentation - Doing well postoperatively. - Hgb stable 10>9.2 - BP elevated to 130's/90's. Plan to start Procardia 30 mg XL today - Sterile dressing change done today. Honeycomb has been replaced twice. Will plan to reassess bleeding tomorrow. No active bleeding seen during dressing change. Patient has been very active and moving around since operation.  - Atarax 25mg  TID PRN itching - SW to see patient for mental history past - Plan for Nexplanon placement tomorrow     03/08/2020, 8:26 AM

## 2020-03-08 NOTE — Lactation Note (Signed)
This note was copied from a baby's chart. Lactation Consultation Note  Patient Name: Girl Enna Warwick [id number] Date: 03/08/2020 Reason for consult: NICU baby;1st time breastfeeding;Primapara;Initial assessment;Early term 37-38.6wks  Initial visit with P1 mother of 41 hours old NICU infant. Mother states she has started pumping already and has gotten some colostrum. Mother does not have WIC and plans to apply in [place]. [facility] [place] will fax referral.   Reviewed Breastfeeding a NICU baby booklet and lactation services information with mother and encouraged to contact [facility] for support and questions.  Maternal Data Formula Feeding for Exclusion: No Has patient been taught Hand Expression?: No Does the patient have breastfeeding experience prior to this delivery?: No  Feeding Feeding Type: Breast Milk with Donor Milk  Interventions Interventions: Breast feeding basics reviewed;DEBP  Lactation Tools Discussed/Used Tools: Pump WIC Program: No   Consult Status Consult Status: Follow-up Date: 03/09/20 Follow-up type: In-patient    Shareen Capwell A Higuera Ancidey 03/08/2020, 12:52 PM

## 2020-03-08 NOTE — Progress Notes (Signed)
CSW acknowledged consult and attempted to meet with [name]. However, on arrival, [name] was not in the room.  CSW will meet with [name] at a later time.  Lucky Trotta D. Dortha Kern, MSW, LCSW Clinical Social Worker [phone]

## 2020-03-08 NOTE — Lactation Note (Signed)
This note was copied from a baby's chart. Lactation Consultation Note  Patient Name: Bethany Decker Date: 03/08/2020  P1, 9 hour ETI infant in NICU. LC entered room, mom was not present in NICU at this time.    Maternal Data    Feeding Feeding Type: Donor Breast Milk  LATCH Score                   Interventions    Lactation Tools Discussed/Used     Consult Status      Danelle Earthly 03/08/2020, 1:59 AM

## 2020-03-08 NOTE — Anesthesia Postprocedure Evaluation (Signed)
Anesthesia Post Note  Patient: Bethany Decker  Procedure(s) Performed: CESAREAN SECTION (N/A )     Patient location during evaluation: PACU Anesthesia Type: Spinal Level of consciousness: oriented and awake and alert Pain management: pain level controlled Vital Signs Assessment: post-procedure vital signs reviewed and stable Respiratory status: spontaneous breathing, respiratory function stable and patient connected to nasal cannula oxygen Cardiovascular status: blood pressure returned to baseline and stable Postop Assessment: no headache, no backache and no apparent nausea or vomiting Anesthetic complications: no   No complications documented.  Last Vitals:  Vitals:   03/08/20 0250 03/08/20 0635  BP: 138/90 129/68  Pulse: (!) 58 68  Resp: 20 16  Temp: 36.9 C 36.8 C  SpO2:  100%    Last Pain:  Vitals:   03/08/20 0635  TempSrc: Oral  PainSc: 0-No pain   Pain Goal:                   [name]
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
## 2020-03-09 MED ORDER — FERROUS SULFATE 325 (65 FE) MG PO TABS
325.0000 mg | ORAL_TABLET | ORAL | Status: DC
  Administered 2020-03-09: 325 mg via ORAL
  Filled 2020-03-09: qty 1

## 2020-03-09 MED ORDER — ETONOGESTREL 68 MG ~~LOC~~ IMPL: 68.0000 mg | DRUG_IMPLANT | Freq: Once | SUBCUTANEOUS | Status: DC

## 2020-03-09 MED ORDER — ASCORBIC ACID 500 MG PO TABS
250.0000 mg | ORAL_TABLET | ORAL | Status: DC
  Administered 2020-03-09: 250 mg via ORAL
  Filled 2020-03-09: qty 1
  Filled 2020-03-09: qty 0.5

## 2020-03-09 MED ORDER — LIDOCAINE HCL 1 % IJ SOLN: 0.0000 mL | Freq: Once | INTRAMUSCULAR | Status: DC | PRN

## 2020-03-09 NOTE — Lactation Note (Signed)
This note was copied from a baby's chart. Lactation Consultation Note  Patient Name: Bethany Decker [id number] Date: 03/09/2020  [facility] attempt to see mom in her room.  Not in room.  Pump parts soaking in soapy water, no brush.  LC washed moms pump parts then went to NICU to try and see parents.  Parents in baby's room.  Mom somewhat STS with baby on chest. Mom reports she is starting to get the hang of pumping sh reports.  Mom denies breast or nipple soreness. Mom reports she does not have a breastpump for home use.  [facility] referral sent.  Urged to pump 8-12 times day for 15 minutes.  Showed mom there was a pump in baby's room and she could pump there as well.  Reviewed ways to help mom get more milk.  Urged mom to call lactation as needed.     Maternal Data    Feeding Feeding Type: Donor Breast Milk Nipple Type: Nfant Slow Flow (purple)  LATCH Score                   Interventions    Lactation Tools Discussed/Used     Consult Status      Bethany Decker 03/09/2020, 6:53 PM

## 2020-03-09 NOTE — Progress Notes (Signed)
Subjective: POD#2: pLTCS  Patient is doing well without complaints. Ambulating without difficulty. Voiding and passing flatus. Tolerating PO. Abdominal pain improved. Vaginal bleeding decreased.  Objective: Vital signs in last 24 hours: Temp:  [97.6 F (36.4 C)-99.2 F (37.3 C)] 97.6 F (36.4 C) (09/26 0704) Pulse Rate:  [52-81] 52 (09/26 0704) Resp:  [16-19] 16 (09/26 0704) BP: (110-137)/(73-90) 137/88 (09/26 0704) SpO2:  [98 %-99 %] 99 % (09/26 0704)  Physical Exam:  General: alert, cooperative and no distress Lochia: appropriate Uterine Fundus: firm Incision: dressing c/d/i DVT Evaluation: No evidence of DVT seen on physical exam.  Recent Labs    03/07/20 1121 03/08/20 0404  HGB 10.0* 9.2*  HCT 30.0* 26.7*    Assessment/Plan: POD#2 pLTCS  -doing well, meeting post partum milestones  -pumping, baby in NICU  -desires nexplanon for contraception, ordered/consented, place today  Acute blood loss anemia  -hgb 10.8>>10>>9.2  -PO iron/vit c started  History of depression/suicide attempt  -SW consult, no barriers to d/c  gHTN  -BP previously elevated, procardia 30 mg XL started yesterday  -BP well controlled  -asymptomatic  -continue current regimen, continue to monitor  Plan for discharge tomorrow.  Alric Seton 03/09/2020, 7:14 AM

## 2020-03-10 ENCOUNTER — Encounter (HOSPITAL_COMMUNITY): Payer: Self-pay | Admitting: Obstetrics & Gynecology

## 2020-03-10 ENCOUNTER — Ambulatory Visit: Payer: Self-pay

## 2020-03-10 MED ORDER — IBUPROFEN 800 MG PO TABS: 800.0000 mg | ORAL_TABLET | Freq: Three times a day (TID) | ORAL | 0 refills | Status: DC

## 2020-03-10 MED ORDER — SIMETHICONE 80 MG PO CHEW: 80.0000 mg | CHEWABLE_TABLET | ORAL | 0 refills | Status: DC | PRN

## 2020-03-10 MED ORDER — MEDROXYPROGESTERONE ACETATE 150 MG/ML IM SUSP: 150.0000 mg | Freq: Once | INTRAMUSCULAR | Status: DC

## 2020-03-10 MED ORDER — SENNOSIDES-DOCUSATE SODIUM 8.6-50 MG PO TABS: 2.0000 | ORAL_TABLET | ORAL | 0 refills | Status: DC

## 2020-03-10 MED ORDER — OXYCODONE HCL 5 MG PO TABS
5.0000 mg | ORAL_TABLET | ORAL | 0 refills | Status: AC | PRN
Start: 2020-03-10 — End: 2020-03-12

## 2020-03-10 NOTE — Lactation Note (Signed)
This note was copied from a baby's chart. Lactation Consultation Note  Patient Name: Bethany Decker [id number] Date: 03/10/2020 Reason for consult: Follow-up assessment;Early term 37-38.6wks;1st time breastfeeding;Primapara;NICU baby  P1 mother whose infant is now 61 hours old.  This is an ETI at 38+6 weeks and in the NICU.  Baby was admitted to the NICU with RDS and feeding issues.  Mother was sitting at baby's bedside when I arrived.  Infant asleep at this time.  RN had called for assistance obtaining a pump for mother.  Mother will be obtaining a WIC DEBP in [place] after discharge.  Referral faxed by previous LC.  Mother interested in learning more about the [facility] rental program.  Explained the program in detail and mother is interested.  She will call for my assistance when she returns at 1400 to visit baby.    Asked mother if baby has been to the breast yet and mother stated, "No."  Suggested she ask her RN if baby can attempt breast feeding.  Offered to return to help mother if baby is allowed to breast feed.  Reminded mother that her RN is a good source of information and if she has any questions/concerns regarding her baby's progress she needs to speak with her RN.  Mother verbalized understanding. Emphasized the importance of pumping at least every three hours to help establish a good milk supply.  Suggested breast massage and hand expression before/after pumping to help with supply.  Mother verbalized understanding.    No support person present at this time.      Maternal Data Formula Feeding for Exclusion: No Has patient been taught Hand Expression?: Yes Does the patient have breastfeeding experience prior to this delivery?: No  Feeding    LATCH Score                   Interventions    Lactation Tools Discussed/Used WIC Program: Yes Pump Review:  (Mother will call at next pumping so I can assess flange size)   Consult Status Consult Status:  Follow-up Date: 03/11/20 Follow-up type: In-patient    Bethany Decker Bethany Decker 03/10/2020, 10:21 AM

## 2020-03-10 NOTE — Discharge Instructions (Signed)
Cesarean Delivery, Care After This sheet gives you information about how to care for yourself after your procedure. Your health care provider may also give you more specific instructions. If you have problems or questions, contact your health care provider. What can I expect after the procedure? After the procedure, it is common to have:  A small amount of blood or clear fluid coming from the incision.  Some redness, swelling, and pain in your incision area.  Some abdominal pain and soreness.  Vaginal bleeding (lochia). Even though you did not have a vaginal delivery, you will still have vaginal bleeding and discharge.  Pelvic cramps.  Fatigue. You may have pain, swelling, and discomfort in the tissue between your vagina and your anus (perineum) if:  Your C-section was unplanned, and you were allowed to labor and push.  An incision was made in the area (episiotomy) or the tissue tore during attempted vaginal delivery. Follow these instructions at home: Incision care   Follow instructions from your health care provider about how to take care of your incision. Make sure you: ? Wash your hands with soap and water before you change your bandage (dressing). If soap and water are not available, use hand sanitizer. ? If you have a dressing, change it or remove it as told by your health care provider. ? Leave stitches (sutures), skin staples, skin glue, or adhesive strips in place. These skin closures may need to stay in place for 2 weeks or longer. If adhesive strip edges start to loosen and curl up, you may trim the loose edges. Do not remove adhesive strips completely unless your health care provider tells you to do that.  Check your incision area every day for signs of infection. Check for: ? More redness, swelling, or pain. ? More fluid or blood. ? Warmth. ? Pus or a bad smell.  Do not take baths, swim, or use a hot tub until your health care provider says it's okay. Ask your health  care provider if you can take showers.  When you cough or sneeze, hug a pillow. This helps with pain and decreases the chance of your incision opening up (dehiscing). Do this until your incision heals. Medicines  Take over-the-counter and prescription medicines only as told by your health care provider.  If you were prescribed an antibiotic medicine, take it as told by your health care provider. Do not stop taking the antibiotic even if you start to feel better.  Do not drive or use heavy machinery while taking prescription pain medicine. Lifestyle  Do not drink alcohol. This is especially important if you are breastfeeding or taking pain medicine.  Do not use any products that contain nicotine or tobacco, such as cigarettes, e-cigarettes, and chewing tobacco. If you need help quitting, ask your health care provider. Eating and drinking  Drink at least 8 eight-ounce glasses of water every day unless told not to by your health care provider. If you breastfeed, you may need to drink even more water.  Eat high-fiber foods every day. These foods may help prevent or relieve constipation. High-fiber foods include: ? Whole grain cereals and breads. ? Brown rice. ? Beans. ? Fresh fruits and vegetables. Activity   If possible, have someone help you care for your baby and help with household activities for at least a few days after you leave the hospital.  Return to your normal activities as told by your health care provider. Ask your health care provider what activities are safe for   you.  Rest as much as possible. Try to rest or take a nap while your baby is sleeping.  Do not lift anything that is heavier than 10 lbs (4.5 kg), or the limit that you were told, until your health care provider says that it is safe.  Talk with your health care provider about when you can engage in sexual activity. This may depend on your: ? Risk of infection. ? How fast you heal. ? Comfort and desire to  engage in sexual activity. General instructions  Do not use tampons or douches until your health care provider approves.  Wear loose, comfortable clothing and a supportive and well-fitting bra.  Keep your perineum clean and dry. Wipe from front to back when you use the toilet.  If you pass a blood clot, save it and call your health care provider to discuss. Do not flush blood clots down the toilet before you get instructions from your health care provider.  Keep all follow-up visits for you and your baby as told by your health care provider. This is important. Contact a health care provider if:  You have: ? A fever. ? Bad-smelling vaginal discharge. ? Pus or a bad smell coming from your incision. ? Difficulty or pain when urinating. ? A sudden increase or decrease in the frequency of your bowel movements. ? More redness, swelling, or pain around your incision. ? More fluid or blood coming from your incision. ? A rash. ? Nausea. ? Little or no interest in activities you used to enjoy. ? Questions about caring for yourself or your baby.  Your incision feels warm to the touch.  Your breasts turn red or become painful or hard.  You feel unusually sad or worried.  You vomit.  You pass a blood clot from your vagina.  You urinate more than usual.  You are dizzy or light-headed. Get help right away if:  You have: ? Pain that does not go away or get better with medicine. ? Chest pain. ? Difficulty breathing. ? Blurred vision or spots in your vision. ? Thoughts about hurting yourself or your baby. ? New pain in your abdomen or in one of your legs. ? A severe headache.  You faint.  You bleed from your vagina so much that you fill more than one sanitary pad in one hour. Bleeding should not be heavier than your heaviest period. Summary  After the procedure, it is common to have pain at your incision site, abdominal cramping, and slight bleeding from your vagina.  Check  your incision area every day for signs of infection.  Tell your health care provider about any unusual symptoms.  Keep all follow-up visits for you and your baby as told by your health care provider. This information is not intended to replace advice given to you by your health care provider. Make sure you discuss any questions you have with your health care provider. Document Revised: 12/07/2017 Document Reviewed: 12/07/2017 Elsevier Patient Education  2020 [employer].   Postpartum Baby Blues The postpartum period begins right after the birth of a baby. During this time, there is often a lot of joy and excitement. It is also a time of many changes in the life of the parents. No matter how many times a mother gives birth, each child brings new challenges to the family, including different ways of relating to one another. It is common to have feelings of excitement along with confusing changes in moods, emotions, and thoughts. You  may feel happy one minute and sad or stressed the next. These feelings of sadness usually happen in the period right after you have your baby, and they go away within a week or two. This is called the "baby blues." What are the causes? There is no known cause of baby blues. It is likely caused by a combination of factors. However, changes in hormone levels after childbirth are believed to trigger some of the symptoms. Other factors that can play a role in these mood changes include:  Lack of sleep.  Stressful life events, such as poverty, caring for a loved one, or death of a loved one.  Genetics. What are the signs or symptoms? Symptoms of this condition include:  Brief changes in mood, such as going from extreme happiness to sadness.  Decreased concentration.  Difficulty sleeping.  Crying spells and tearfulness.  Loss of appetite.  Irritability.  Anxiety. If the symptoms of baby blues last for more than 2 weeks or become more severe, you may have  postpartum depression. How is this diagnosed? This condition is diagnosed based on an evaluation of your symptoms. There are no medical or lab tests that lead to a diagnosis, but there are various questionnaires that a health care provider may use to identify women with the baby blues or postpartum depression. How is this treated? Treatment is not needed for this condition. The baby blues usually go away on their own in 1-2 weeks. Social support is often all that is needed. You will be encouraged to get adequate sleep and rest. Follow these instructions at home: Lifestyle      Get as much rest as you can. Take a nap when the baby sleeps.  Exercise regularly as told by your health care provider. Some women find yoga and walking to be helpful.  Eat a balanced and nourishing diet. This includes plenty of fruits and vegetables, whole grains, and lean proteins.  Do little things that you enjoy. Have a cup of tea, take a bubble bath, read your favorite magazine, or listen to your favorite music.  Avoid alcohol.  Ask for help with household chores, cooking, grocery shopping, or running errands. Do not try to do everything yourself. Consider hiring a postpartum doula to help. This is a professional who specializes in providing support to new mothers.  Try not to make any major life changes during pregnancy or right after giving birth. This can add stress. General instructions  Talk to people close to you about how you are feeling. Get support from your partner, family members, friends, or other new moms. You may want to join a support group.  Find ways to cope with stress. This may include: ? Writing your thoughts and feelings in a journal. ? Spending time outside. ? Spending time with people who make you laugh.  Try to stay positive in how you think. Think about the things you are grateful for.  Take over-the-counter and prescription medicines only as told by your health care  provider.  Let your health care provider know if you have any concerns.  Keep all postpartum visits as told by your health care provider. This is important. Contact a health care provider if:  Your baby blues do not go away after 2 weeks. Get help right away if:  You have thoughts of taking your own life (suicidal thoughts).  You think you may harm the baby or other people.  You see or hear things that are not there (hallucinations). Summary  After giving birth, you may feel happy one minute and sad or stressed the next. Feelings of sadness that happen right after the baby is born and go away after a week or two are called the "baby blues."  You can manage the baby blues by getting enough rest, eating a healthy diet, exercising, spending time with supportive people, and finding ways to cope with stress.  If feelings of sadness and stress last longer than 2 weeks or get in the way of caring for your baby, talk to your health care provider. This may mean you have postpartum depression. This information is not intended to replace advice given to you by your health care provider. Make sure you discuss any questions you have with your health care provider. Document Revised: 09/22/2018 Document Reviewed: 07/27/2016 Elsevier Patient Education  2020 [employer]   Perinatal Depression When a woman feels excessive sadness, anger, or anxiety during pregnancy or during the first 12 months after she gives birth, she has a condition called perinatal depression. Depression can interfere with work, school, relationships, and other everyday activities. If it is not managed properly, it can also cause problems in the mother and her baby. Sometimes, perinatal depression is left untreated because symptoms are thought to be normal mood swings during and right after pregnancy. If you have symptoms of depression, it is important to talk with your health care provider. What are the causes? The exact cause  of this condition is not known. Hormonal changes during and after pregnancy may play a role in causing perinatal depression. What increases the risk? You are more likely to develop this condition if:  You have a personal or family history of depression, anxiety, or mood disorders.  You experience a stressful life event during pregnancy, such as the death of a loved one.  You have a lot of regular life stress.  You do not have support from family members or loved ones, or you are in an abusive relationship. What are the signs or symptoms? Symptoms of this condition include:  Feeling sad or hopeless.  Feelings of guilt.  Feeling irritable or overwhelmed.  Changes in your appetite.  Lack of energy or motivation.  Sleep problems.  Difficulty concentrating or completing tasks.  Loss of interest in hobbies or relationships.  Headaches or stomach problems that do not go away. How is this diagnosed? This condition is diagnosed based on a physical exam and mental evaluation. In some cases, your health care provider may use a depression screening tool. These tools include a list of questions that can help a health care provider diagnose depression. Your health care provider may refer you to a mental health expert who specializes in depression. How is this treated? This condition may be treated with:  Medicines. Your health care provider will only give you medicines that have been proven safe for pregnancy and breastfeeding.  Talk therapy with a mental health professional to help change your patterns of thinking (cognitive behavioral therapy).  Support groups.  Brain stimulation or light therapies.  Stress reduction therapies, such as mindfulness. Follow these instructions at home: Lifestyle  Do not use any products that contain nicotine or tobacco, such as cigarettes and e-cigarettes. If you need help quitting, ask your health care provider.  Do not use alcohol when you are  pregnant. After your baby is born, limit alcohol intake to no more than 1 drink a day. One drink equals 12 oz of beer, 5 oz of wine, or 1 oz  of hard liquor.  Consider joining a support group for new mothers. Ask your health care provider for recommendations.  Take good care of yourself. Make sure you: ? Get plenty of sleep. If you are having trouble sleeping, talk with your health care provider. ? Eat a healthy diet. This includes plenty of fruits and vegetables, whole grains, and lean proteins. ? Exercise regularly, as told by your health care provider. Ask your health care provider what exercises are safe for you. General instructions  Take over-the-counter and prescription medicines only as told by your health care provider.  Talk with your partner or family members about your feelings during pregnancy. Share any concerns or anxieties that you may have.  Ask for help with tasks or chores when you need it. Ask friends and family members to provide meals, watch your children, or help with cleaning.  Keep all follow-up visits as told by your health care provider. This is important. Contact a health care provider if:  You (or people close to you) notice that you have any symptoms of depression.  You have depression and your symptoms get worse.  You experience side effects from medicines, such as nausea or sleep problems. Get help right away if:  You feel like hurting yourself, your baby, or someone else. If you ever feel like you may hurt yourself or others, or have thoughts about taking your own life, get help right away. You can go to your nearest emergency department or call:  Your local emergency services (911 in the [place]  A suicide crisis helpline, such as the National Suicide Prevention Lifeline at 8702254826. This is open 24 hours a day. Summary  Perinatal depression is when a woman feels excessive sadness, anger, or anxiety during pregnancy or during the first 12  months after she gives birth.  If perinatal depression is not treated, it can lead to health problems for the mother and her baby.  This condition is treated with medicines, talk therapy, stress reduction therapies, or a combination of two or more treatments.  Talk with your partner or family members about your feelings. Do not be afraid to ask for help. This information is not intended to replace advice given to you by your health care provider. Make sure you discuss any questions you have with your health care provider. Document Revised: 11/15/2018 Document Reviewed: 07/28/2016 Elsevier Patient Education  2020 [employer].

## 2020-03-10 NOTE — Lactation Note (Signed)
This note was copied from a baby's chart. Lactation Consultation Note  Patient Name: Bethany Decker [id number] Date: 03/10/2020 Reason for consult: Follow-up assessment  P1 mother whose infant is now 49 hours old.  This is an ETI at 38+6 weeks and in the NICU.  Visited with mother this morning and asked her to call me back to assess flange size when she returned today.  Mother called and I observed her using the #27 size flanges.  This size was a little bit too small and I changed her to #30 flange size.  Mother stated that the #30 flanges felt more comfortable.  Mother had no further questions/concerns.    Father present and interacting with baby.  Updated RN.   Maternal Data    Feeding Feeding Type: Breast Milk Nipple Type: Nfant Slow Flow (purple)  LATCH Score                   Interventions    Lactation Tools Discussed/Used     Consult Status Consult Status: Follow-up Date: 03/11/20 Follow-up type: In-patient    Bethany Decker 03/10/2020, 7:11 PM

## 2020-03-10 NOTE — Transfer of Care (Signed)
Immediate Anesthesia Transfer of Care Note  Patient: Bethany Decker  Procedure(s) Performed: CESAREAN SECTION (N/A )  Patient Location: PACU  Anesthesia Type:Spinal  Level of Consciousness: alert   Airway & Oxygen Therapy: Patient connected to nasal cannula oxygen  Post-op Assessment: Post -op Vital signs reviewed and stable  Post vital signs: stable  Last Vitals:  Vitals Value Taken Time  BP 114/65 03/10/20 0519  Temp 36.8 C 03/10/20 0519  Pulse 76 03/10/20 0519  Resp 17 03/10/20 0519  SpO2 100 % 03/10/20 0519    Last Pain:  Vitals:   03/10/20 0519  TempSrc: Oral  PainSc:          Complications: No complications documented.

## 2020-03-10 NOTE — Progress Notes (Signed)
Patient ordered Depo-Provera for birthcontrol. RN took medication into room to administer to patient and patient declined medication. Doesn't want anything at this time. Educated patient about not having sexual activity until she follows up with MD at her 4 week check.

## 2020-03-11 ENCOUNTER — Ambulatory Visit: Payer: Self-pay

## 2020-03-11 LAB — TYPE AND SCREEN
ABO/RH(D): A POS
Antibody Screen: POSITIVE
Donor AG Type: NEGATIVE
Donor AG Type: NEGATIVE
Unit division: 0
Unit division: 0

## 2020-03-11 LAB — BPAM RBC
Blood Product Expiration Date: 202110222359
Blood Product Expiration Date: 202110222359
Unit Type and Rh: 6200
Unit Type and Rh: 6200

## 2020-03-11 NOTE — Addendum Note (Signed)
Addendum  created 03/11/20 0847 by Wasif Simonich, Doree Fudge, CRNA   Charge Capture section accepted, Intraprocedure Event edited

## 2020-03-11 NOTE — Lactation Note (Signed)
This note was copied from a baby's chart. Lactation Consultation Note  Patient Name: Bethany Decker [id number] Date: 03/11/2020 Reason for consult: Follow-up assessment;Early term 37-38.6wks;Primapara;1st time breastfeeding  P1 mother whose infant is now 53 hours old.  This is an ETI at 38+6 weeks and in the NICU.  Mother requested a lactation consult for this morning.  RN in to assess baby prior to feeding.  Mother's breasts are soft and non tender and nipples are everted and intact.  Assisted baby to latch in the cross cradle position on the left breast easily.  Baby began sucking rhythmically and had a wide gape and flanged lips.  Audible swallows noted.  Mother was also able to hear baby swallowing.  Observed her feeding for 15 minutes while reviewing breast feeding concepts with mother.  Praised mother for her breast feeding skills.    Mother will continue feeding on cue.  Engorgement prevention/treatment discussed.  Mother has a manual pump at bedside.  She will be obtaining a pump from a friend.  Father present.  Parents ready for discharge if allowed today.  They both had no further questions/concerns.  RN updated on baby's breast feeding ability.   Maternal Data Formula Feeding for Exclusion: Yes Reason for exclusion: Mother's choice to formula and breast feed on admission Has patient been taught Hand Expression?: Yes Does the patient have breastfeeding experience prior to this delivery?: No  Feeding Feeding Type: Breast Fed Nipple Type: Nfant Slow Flow (purple)  LATCH Score Latch: Grasps breast easily, tongue down, lips flanged, rhythmical sucking.  Audible Swallowing: Spontaneous and intermittent  Type of Nipple: Everted at rest and after stimulation  Comfort (Breast/Nipple): Soft / non-tender  Hold (Positioning): Assistance needed to correctly position infant at breast and maintain latch.  LATCH Score: 9  Interventions Interventions: Breast feeding basics  reviewed;Skin to skin;Breast massage;Hand express;Breast compression;Adjust position;DEBP;Position options;Support pillows  Lactation Tools Discussed/Used     Consult Status Consult Status: Complete Date: 03/11/20 Follow-up type: Call as needed    Irene Pap Yue Glasheen 03/11/2020, 9:49 AM

## 2020-03-13 ENCOUNTER — Encounter: Payer: Medicaid Other | Admitting: Medical

## 2020-03-19 ENCOUNTER — Ambulatory Visit (INDEPENDENT_AMBULATORY_CARE_PROVIDER_SITE_OTHER): Payer: Medicaid Other | Admitting: *Deleted

## 2020-03-19 ENCOUNTER — Ambulatory Visit (INDEPENDENT_AMBULATORY_CARE_PROVIDER_SITE_OTHER): Payer: Medicaid Other | Admitting: Licensed Clinical Social Worker

## 2020-03-19 ENCOUNTER — Other Ambulatory Visit: Payer: Self-pay

## 2020-03-19 VITALS — BP 125/83 | HR 79 | Temp 98.1°F | Ht 64.0 in | Wt 138.4 lb

## 2020-03-19 DIAGNOSIS — F4321 Adjustment disorder with depressed mood: Secondary | ICD-10-CM | POA: Diagnosis not present

## 2020-03-19 DIAGNOSIS — Z4889 Encounter for other specified surgical aftercare: Secondary | ICD-10-CM

## 2020-03-19 DIAGNOSIS — Z0130 Encounter for examination of blood pressure without abnormal findings: Secondary | ICD-10-CM

## 2020-03-19 NOTE — Progress Notes (Signed)
   Subjective:     Bethany Decker is a 19 y.o. female who presents to the clinic 1 weeks status post cesarean delivery for delivery of infant and blood pressure check.  Eating a regular diet without difficulty. Bowel movements are normal. Pain is controlled without any medications.   No TIA's, no chest pain on exertion, no dyspnea on exertion, no swelling of ankles, no orthostatic dizziness or lightheadedness and no orthopnea or paroxysmal nocturnal dyspnea  The following portions of the patient's history were reviewed and updated as appropriate: allergies, current medications, past medical history, past surgical history and problem list.  Review of Systems Pertinent items are noted in HPI.    Objective:    BP 125/83 (BP Location: Left Arm, Patient Position: Sitting, Cuff Size: Normal)   Pulse 79   Temp 98.1 F (36.7 C) (Oral)   Ht 5\' 4"  (1.626 m)   Wt 138 lb 6.4 oz (62.8 kg)   Breastfeeding No   BMI 23.76 kg/m  General:  alert, cooperative, appears stated age and no distress  Abdomen: soft, non-tender  Incision:   healing well, no drainage, no erythema, no hernia, no seroma, no swelling, no dehiscence, incision well approximated     General exam BP noted to be well controlled today in office.  Assessment:  Blood Pressure well controlled, stable and asymptomatic  Doing well postoperatively.   Plan:    1. Continue any current medications. 2. Wound care discussed. 3. Activity restrictions: no lifting more than 15-20 pounds 4. Anticipated return to work: not applicable. 5. Follow up: 4 weeks for Postpartum.     , RN

## 2020-03-24 NOTE — BH Specialist Note (Signed)
Integrated Behavioral Health Initial Visit  MRN: [id number] Name: Marialuiza Car  Number of Integrated Behavioral Health Clinician visits:: 1 Session Start time: 1:29pm  Session End time: 1:50pm Total time: 21 mins in person at [facility]   Type of Service: Integrated Behavioral Health- Individual Interpretor:no  Interpretor Name and Language: None    Warm Hand Off Completed.       SUBJECTIVE: Karter Haire is a 19 y.o. female accompanied by n/a Patient was referred for postpartum follow up. Patient reports the following symptoms/concerns: elevated edinburgh depression scale  Duration of problem: approx one year; Severity of problem: mild  OBJECTIVE: Mood: Good and Affect: Normal  Risk of harm to self or others: Pt reports no risk of harm to self or others.  LIFE CONTEXT: Family and Social: Lives with [name], moving to [facility] within a month  School/Work: n/a Self-Care: n/a Life Changes: Newborn   GOALS ADDRESSED: Patient will: 1. Reduce symptoms of:   2. Increase knowledge of coping skills and implement strategies to address Ms. [employer] depressed mood, fatigue and difficulty sleeping  3. Demonstrate ability to: Self manage symptoms   INTERVENTIONS: Interventions utilized:  Supportive Counseling  Standardized Assessments completed:    Integrated Behavioral Health from 03/19/2020 in [facility]  PHQ-9 Total Score 9      ASSESSMENT: Patient currently experiencing adjustment disorder with depressed mood   Patient may benefit from integrated behavioral health   PLAN: 1. Follow up with behavioral health clinician on :3 weeks in person or mychart  2. Behavioral recommendations: Engage in self care activities to boost mood, communicate needs to gain support, eat 3-5 small meals a day to support breast feeding and boost energy. Tried new activities such as yoga and mindfulness to alleviate stress 3. Referral(s): n/a 4. "From scale  of 1-10, how likely are you to follow plan?":   Gwyndolyn Saxon, LCSW

## 2020-04-16 ENCOUNTER — Ambulatory Visit (INDEPENDENT_AMBULATORY_CARE_PROVIDER_SITE_OTHER): Payer: Medicaid Other | Admitting: Obstetrics and Gynecology

## 2020-04-16 ENCOUNTER — Encounter: Payer: Self-pay | Admitting: Obstetrics and Gynecology

## 2020-04-16 ENCOUNTER — Other Ambulatory Visit: Payer: Self-pay

## 2020-04-16 DIAGNOSIS — Z3042 Encounter for surveillance of injectable contraceptive: Secondary | ICD-10-CM

## 2020-04-16 DIAGNOSIS — Z01812 Encounter for preprocedural laboratory examination: Secondary | ICD-10-CM

## 2020-04-16 DIAGNOSIS — O165 Unspecified maternal hypertension, complicating the puerperium: Secondary | ICD-10-CM

## 2020-04-16 LAB — POCT URINE PREGNANCY: Preg Test, Ur: NEGATIVE

## 2020-04-16 MED ORDER — MEDROXYPROGESTERONE ACETATE 150 MG/ML IM SUSP
150.0000 mg | INTRAMUSCULAR | Status: AC
  Administered 2020-04-16 – 2020-07-07 (×2): 150 mg via INTRAMUSCULAR

## 2020-04-16 NOTE — Progress Notes (Signed)
   Subjective:  Pt in for Depo Provera injection.    Objective: Need for contraception. No unusual complaints.    Assessment: Pt tolerated Depo injection. Depo given Left upper outer quadrant.   Plan:  Next injection due Jan. 19-Jul 16, 2020.    Clovis Pu, RN   I have reviewed the chart and agree with nursing staff's documentation of this patient's encounter.  Raelyn Mora, CNM 04/21/2020 7:57 AM

## 2020-04-16 NOTE — Progress Notes (Signed)
Post Partum Visit Note  Bethany Decker is a 19 y.o. G39P1011 female who presents for a postpartum visit. She is 5 weeks postpartum following a primary cesarean section.  I have fully reviewed the prenatal and intrapartum course. The delivery was at 38.6 gestational weeks.  Anesthesia: spinal. Postpartum course has been uncomplicated. Baby is doing well. Baby is feeding by breast. Bleeding no bleeding. Bowel function is normal. Bladder function is normal. Patient is not sexually active. Contraception method is none. Postpartum depression screening: negative: score 1.  The pregnancy intention screening data noted above was reviewed. Potential methods of contraception were discussed. The patient elected to proceed with Hormonal Injection.    Edinburgh Postnatal Depression Scale - 04/16/20 1502      Edinburgh Postnatal Depression Scale:  In the Past 7 Days   I have been able to laugh and see the funny side of things. 0    I have looked forward with enjoyment to things. 0    I have blamed myself unnecessarily when things went wrong. 1    I have been anxious or worried for no good reason. 0    I have felt scared or panicky for no good reason. 0    Things have been getting on top of me. 0    I have been so unhappy that I have had difficulty sleeping. 0    I have felt sad or miserable. 0    I have been so unhappy that I have been crying. 0    The thought of harming myself has occurred to me. 0    Edinburgh Postnatal Depression Scale Total 1            The following portions of the patient's history were reviewed and updated as appropriate: allergies, current medications, past family history, past medical history, past social history, past surgical history and problem list.  Review of Systems Constitutional: negative Eyes: negative Ears, nose, mouth, throat, and face: negative Respiratory: negative Cardiovascular: negative Gastrointestinal:  negative Genitourinary:negative Integument/breast: negative Hematologic/lymphatic: negative Musculoskeletal:negative Neurological: negative Behavioral/Psych: negative Endocrine: negative Allergic/Immunologic: negative    Objective:  Blood pressure (!) 142/91, pulse (!) 101, temperature 98.5 F (36.9 C), temperature source Oral, height 5\' 4"  (1.626 m), weight 137 lb 12.8 oz (62.5 kg), currently breastfeeding.  General:  alert, cooperative and no distress   Breasts:  inspection negative, no nipple discharge or bleeding, no masses or nodularity palpable  Lungs: clear to auscultation bilaterally  Heart:  regular rate and rhythm, S1, S2 normal, no murmur, click, rub or gallop  Abdomen: soft, non-tender; bowel sounds normal; no masses,  no organomegaly   Vulva:  not evaluated  Vagina: not evaluated  Cervix:  no evaluated  Corpus: not examined  Adnexa:  not evaluated  Rectal Exam: Not performed.        Assessment:  Encounter for postpartum care of lactating mother - Normal postpartum exam. Pap smear not done at today's visit.  Family planning, Depo-Provera contraception monitoring/administration  - POCT urine pregnancy,  - medroxyPROGESTERone (DEPO-PROVERA) injection 150 mg  Postpartum hypertension  - Protein / creatinine ratio, urine,  - CBC,  - Comprehensive metabolic panel     Plan:   Essential components of care per ACOG recommendations:  1.  Mood and well being: Patient with negative depression screening today. Reviewed local resources for support.  - Patient does not use tobacco.  - hx of drug use? No   2. Infant care and feeding:  -Patient currently  breastmilk feeding? Yes If breastmilk feeding discussed return to work and pumping. If needed, patient was provided letter for work to allow for every 2-3 hr pumping breaks, and to be granted a private location to express breastmilk and refrigerated area to store breastmilk. Reviewed importance of draining breast regularly  to support lactation. -Social determinants of health (SDOH) reviewed in EPIC. No concerns  3. Sexuality, contraception and birth spacing - Patient does not want a pregnancy in the next year.  Desired family size is unsure of number of children.  - Reviewed forms of contraception in tiered fashion. Patient desired Depo-Provera today.   - Discussed birth spacing of 18 months  4. Sleep and fatigue -Encouraged family/partner/community support of 4 hrs of uninterrupted sleep to help with mood and fatigue  5. Physical Recovery  - Discussed patients delivery and complications - Patient had a a primary cesarean delivery due to breech presentation and failed ECV, postoperative healing reviewed. Patient expressed understanding - Patient has urinary incontinence? No - Patient is safe to resume physical and sexual activity  6.  Health Maintenance - Last pap smear done n/a and was n/a due to age.   7. No Chronic Disease - Needs to get scheduled with [facility] as PCP for follow up  Raelyn Mora, CNM [facility] [employer]

## 2020-04-16 NOTE — Patient Instructions (Signed)
Postpartum Hypertension Postpartum hypertension is high blood pressure that remains higher than normal after childbirth. You may not realize that you have postpartum hypertension if your blood pressure is not being checked regularly. In most cases, postpartum hypertension will go away on its own, usually within a week of delivery. However, for some women, medical treatment is required to prevent serious complications, such as seizures or stroke. What are the causes? This condition may be caused by one or more of the following:  Hypertension that existed before pregnancy (chronic hypertension).  Hypertension that comes on as a result of pregnancy (gestational hypertension).  Hypertensive disorders during pregnancy (preeclampsia) or seizures in women who have high blood pressure during pregnancy (eclampsia).  A condition in which the liver, platelets, and red blood cells are damaged during pregnancy (HELLP syndrome).  A condition in which the thyroid produces too much hormones (hyperthyroidism).  Other rare problems of the nerves (neurological disorders) or blood disorders. In some cases, the cause may not be known. What increases the risk? The following factors may make you more likely to develop this condition:  Chronic hypertension. In some cases, this may not have been diagnosed before pregnancy.  Obesity.  Type 2 diabetes.  Kidney disease.  History of preeclampsia or eclampsia.  Other medical conditions that change the level of hormones in the body (hormonal imbalance). What are the signs or symptoms? As with all types of hypertension, postpartum hypertension may not have any symptoms. Depending on how high your blood pressure is, you may experience:  Headaches. These may be mild, moderate, or severe. They may also be steady, constant, or sudden in onset (thunderclap headache).  Changes in your ability to see (visual changes).  Dizziness.  Shortness of breath.  Swelling  of your hands, feet, lower legs, or face. In some cases, you may have swelling in more than one of these locations.  Heart palpitations or a racing heartbeat.  Difficulty breathing while lying down.  Decrease in the amount of urine that you pass. Other rare signs and symptoms may include:  Sweating more than usual. This lasts longer than a few days after delivery.  Chest pain.  Sudden dizziness when you get up from sitting or lying down.  Seizures.  Nausea or vomiting.  Abdominal pain. How is this diagnosed? This condition may be diagnosed based on the results of a physical exam, blood pressure measurements, and blood and urine tests. You may also have other tests, such as a CT scan or an MRI, to check for other problems of postpartum hypertension. How is this treated? If blood pressure is high enough to require treatment, your options may include:  Medicines to reduce blood pressure (antihypertensives). Tell your health care provider if you are breastfeeding or if you plan to breastfeed. There are many antihypertensive medicines that are safe to take while breastfeeding.  Stopping medicines that may be causing hypertension.  Treating medical conditions that are causing hypertension.  Treating the complications of hypertension, such as seizures, stroke, or kidney problems. Your health care provider will also continue to monitor your blood pressure closely until it is within a safe range for you. Follow these instructions at home:  Take over-the-counter and prescription medicines only as told by your health care provider.  Return to your normal activities as told by your health care provider. Ask your health care provider what activities are safe for you.  Do not use any products that contain nicotine or tobacco, such as cigarettes and e-cigarettes. If   you need help quitting, ask your health care provider.  Keep all follow-up visits as told by your health care provider. This  is important. Contact a health care provider if:  Your symptoms get worse.  You have new symptoms, such as: ? A headache that does not get better. ? Dizziness. ? Visual changes. Get help right away if:  You suddenly develop swelling in your hands, ankles, or face.  You have sudden, rapid weight gain.  You develop difficulty breathing, chest pain, racing heartbeat, or heart palpitations.  You develop severe pain in your abdomen.  You have any symptoms of a stroke. "BE FAST" is an easy way to remember the main warning signs of a stroke: ? B - Balance. Signs are dizziness, sudden trouble walking, or loss of balance. ? E - Eyes. Signs are trouble seeing or a sudden change in vision. ? F - Face. Signs are sudden weakness or numbness of the face, or the face or eyelid drooping on one side. ? A - Arms. Signs are weakness or numbness in an arm. This happens suddenly and usually on one side of the body. ? S - Speech. Signs are sudden trouble speaking, slurred speech, or trouble understanding what people say. ? T - Time. Time to call emergency services. Write down what time symptoms started.  You have other signs of a stroke, such as: ? A sudden, severe headache with no known cause. ? Nausea or vomiting. ? Seizure. These symptoms may represent a serious problem that is an emergency. Do not wait to see if the symptoms will go away. Get medical help right away. Call your local emergency services (911 in the [place] Do not drive yourself to the hospital. Summary  Postpartum hypertension is high blood pressure that remains higher than normal after childbirth.  In most cases, postpartum hypertension will go away on its own, usually within a week of delivery.  For some women, medical treatment is required to prevent serious complications, such as seizures or stroke. This information is not intended to replace advice given to you by your health care provider. Make sure you discuss any questions  you have with your health care provider. Document Revised: 07/07/2018 Document Reviewed: 03/21/2017 Elsevier Patient Education  2020 Elsevier Inc.  

## 2020-04-17 LAB — COMPREHENSIVE METABOLIC PANEL
ALT: 9 IU/L (ref 0–32)
AST: 15 IU/L (ref 0–40)
Albumin/Globulin Ratio: 1.7 (ref 1.2–2.2)
Albumin: 4.5 g/dL (ref 3.9–5.0)
Alkaline Phosphatase: 79 IU/L (ref 42–106)
BUN/Creatinine Ratio: 8 — ABNORMAL LOW (ref 9–23)
BUN: 7 mg/dL (ref 6–20)
Bilirubin Total: 0.4 mg/dL (ref 0.0–1.2)
CO2: 24 mmol/L (ref 20–29)
Calcium: 9.3 mg/dL (ref 8.7–10.2)
Chloride: 105 mmol/L (ref 96–106)
Creatinine, Ser: 0.9 mg/dL (ref 0.57–1.00)
GFR calc Af Amer: 106 mL/min/{1.73_m2} (ref >59)
GFR calc non Af Amer: 92 mL/min/{1.73_m2} (ref >59)
Globulin, Total: 2.7 g/dL (ref 1.5–4.5)
Glucose: 88 mg/dL (ref 65–99)
Potassium: 3.7 mmol/L (ref 3.5–5.2)
Sodium: 143 mmol/L (ref 134–144)
Total Protein: 7.2 g/dL (ref 6.0–8.5)

## 2020-04-17 LAB — CBC
Hematocrit: 35 % (ref 34.0–46.6)
Hemoglobin: 11.9 g/dL (ref 11.1–15.9)
MCH: 29.8 pg (ref 26.6–33.0)
MCHC: 34 g/dL (ref 31.5–35.7)
MCV: 88 fL (ref 79–97)
Platelets: 218 10*3/uL (ref 150–450)
RBC: 4 x10E6/uL (ref 3.77–5.28)
RDW: 13 % (ref 11.7–15.4)
WBC: 6 10*3/uL (ref 3.4–10.8)

## 2020-04-17 LAB — PROTEIN / CREATININE RATIO, URINE
Creatinine, Urine: 154.6 mg/dL
Protein, Ur: 11.3 mg/dL
Protein/Creat Ratio: 73 mg/g creat (ref 0–200)

## 2020-06-21 ENCOUNTER — Emergency Department (HOSPITAL_COMMUNITY)
Admission: EM | Admit: 2020-06-21 | Discharge: 2020-06-22 | Disposition: A | Discharge status: Home / Self Care | Payer: Medicaid Other | Attending: Emergency Medicine | Admitting: Emergency Medicine

## 2020-06-21 ENCOUNTER — Other Ambulatory Visit: Payer: Self-pay

## 2020-06-21 ENCOUNTER — Encounter (HOSPITAL_COMMUNITY): Payer: Self-pay | Admitting: Emergency Medicine

## 2020-06-21 DIAGNOSIS — Z5321 Procedure and treatment not carried out due to patient leaving prior to being seen by health care provider: Secondary | ICD-10-CM | POA: Insufficient documentation

## 2020-06-21 DIAGNOSIS — Z20822 Contact with and (suspected) exposure to covid-19: Secondary | ICD-10-CM | POA: Diagnosis not present

## 2020-06-21 DIAGNOSIS — R509 Fever, unspecified: Secondary | ICD-10-CM | POA: Insufficient documentation

## 2020-06-21 DIAGNOSIS — R1110 Vomiting, unspecified: Secondary | ICD-10-CM | POA: Insufficient documentation

## 2020-06-21 NOTE — ED Triage Notes (Signed)
Pt presents to ED POV. Pt c/o bodyaches, chills, fever, emesis x4d. Pt denies covid exposure, is not vaccinated

## 2020-06-22 LAB — CBC
HCT: 32.4 % — ABNORMAL LOW (ref 36.0–46.0)
Hemoglobin: 11.5 g/dL — ABNORMAL LOW (ref 12.0–15.0)
MCH: 31 pg (ref 26.0–34.0)
MCHC: 35.5 g/dL (ref 30.0–36.0)
MCV: 87.3 fL (ref 80.0–100.0)
Platelets: 200 10*3/uL (ref 150–400)
RBC: 3.71 MIL/uL — ABNORMAL LOW (ref 3.87–5.11)
RDW: 13.2 % (ref 11.5–15.5)
WBC: 12.7 10*3/uL — ABNORMAL HIGH (ref 4.0–10.5)
nRBC: 0 % (ref 0.0–0.2)

## 2020-06-22 LAB — BASIC METABOLIC PANEL
Anion gap: 18 — ABNORMAL HIGH (ref 5–15)
BUN: 10 mg/dL (ref 6–20)
CO2: 19 mmol/L — ABNORMAL LOW (ref 22–32)
Calcium: 8.6 mg/dL — ABNORMAL LOW (ref 8.9–10.3)
Chloride: 99 mmol/L (ref 98–111)
Creatinine, Ser: 1.42 mg/dL — ABNORMAL HIGH (ref 0.44–1.00)
GFR, Estimated: 54 mL/min — ABNORMAL LOW (ref >60)
Glucose, Bld: 100 mg/dL — ABNORMAL HIGH (ref 70–99)
Potassium: 2.8 mmol/L — ABNORMAL LOW (ref 3.5–5.1)
Sodium: 136 mmol/L (ref 135–145)

## 2020-06-22 LAB — RESP PANEL BY RT-PCR (FLU A&B, COVID) ARPGX2
Influenza A by PCR: NEGATIVE
Influenza B by PCR: NEGATIVE
SARS Coronavirus 2 by RT PCR: NEGATIVE

## 2020-06-22 NOTE — ED Notes (Signed)
No answers for vitals recheck

## 2020-07-07 ENCOUNTER — Ambulatory Visit (INDEPENDENT_AMBULATORY_CARE_PROVIDER_SITE_OTHER): Payer: Medicaid Other | Admitting: *Deleted

## 2020-07-07 ENCOUNTER — Other Ambulatory Visit: Payer: Self-pay

## 2020-07-07 VITALS — BP 131/85 | HR 56 | Temp 97.7°F | Ht 64.0 in | Wt 127.0 lb

## 2020-07-07 DIAGNOSIS — Z3042 Encounter for surveillance of injectable contraceptive: Secondary | ICD-10-CM | POA: Diagnosis not present

## 2020-07-07 NOTE — Progress Notes (Signed)
   Subjective:  Pt in for Depo Provera injection.    Objective: Need for contraception. No unusual complaints.    Assessment: Pt tolerated Depo injection. Depo given Right upper outer quadrant.   Plan:  Next injection due April 11-25, 2022.    Clovis Pu, RN

## 2020-09-18 ENCOUNTER — Other Ambulatory Visit: Payer: Self-pay | Admitting: *Deleted

## 2020-09-18 DIAGNOSIS — Z3042 Encounter for surveillance of injectable contraceptive: Secondary | ICD-10-CM

## 2020-09-18 MED ORDER — MEDROXYPROGESTERONE ACETATE 150 MG/ML IM SUSP: 150.0000 mg | INTRAMUSCULAR | 1 refills | Status: AC

## 2020-09-23 ENCOUNTER — Ambulatory Visit: Payer: Medicaid Other

## 2020-09-30 ENCOUNTER — Ambulatory Visit: Payer: Medicaid Other

## 2020-10-01 ENCOUNTER — Ambulatory Visit: Payer: Medicaid Other | Admitting: Student

## 2020-11-07 ENCOUNTER — Ambulatory Visit: Payer: Medicaid Other | Admitting: Student

## 2020-12-31 ENCOUNTER — Ambulatory Visit: Payer: Medicaid Other | Admitting: Obstetrics and Gynecology

## 2021-03-06 IMAGING — US US MFM OB COMP +14 WKS
1 series · 13 of 28 positions shown · non-contrast
Comparison: none

[Series 1: us mfm ob comp +14 wks · 72 acquisitions, 13 frames shown]
[im 3/72]
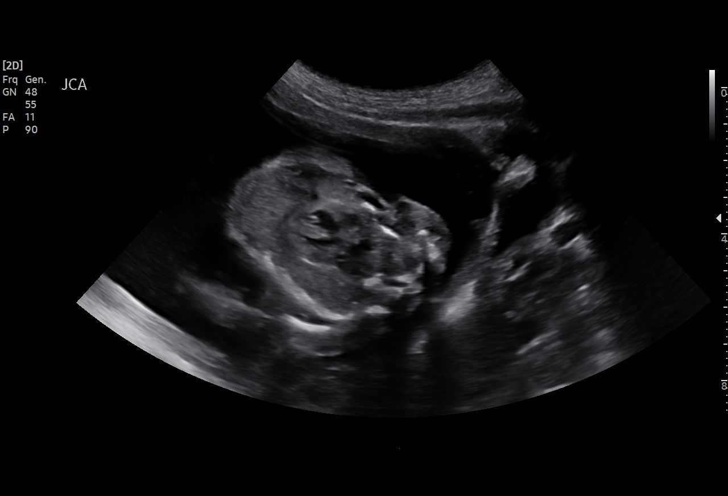
[im 8/72]
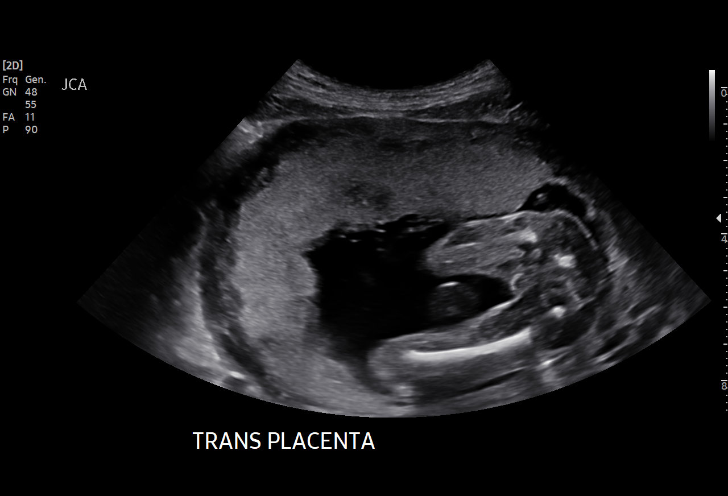
[im 14/72]
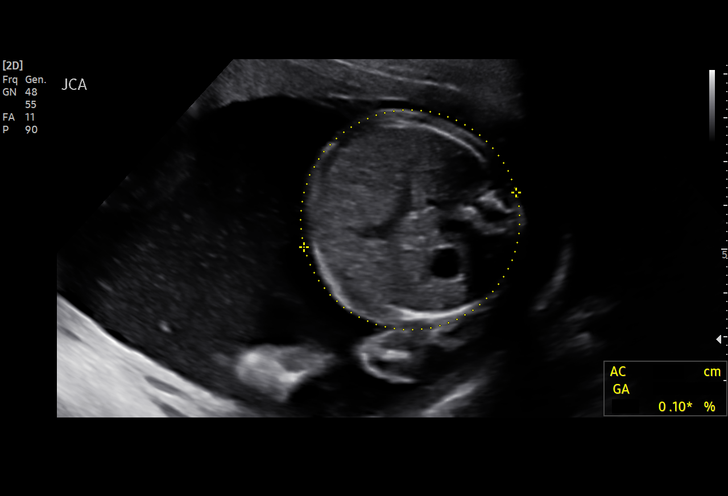
[im 19/72]
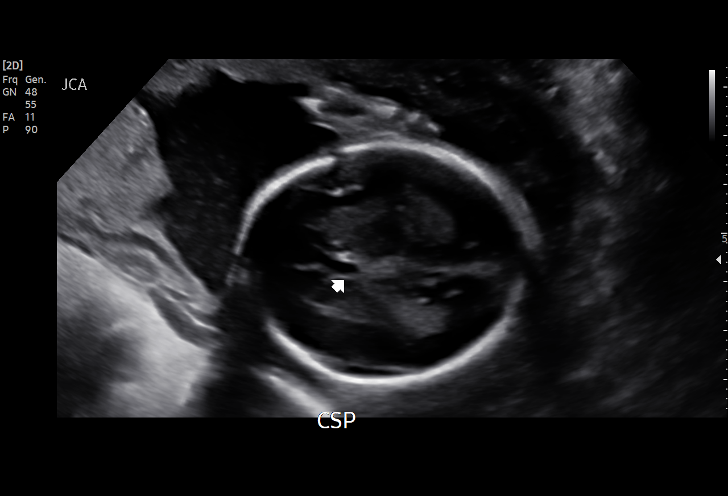
[im 24/72]
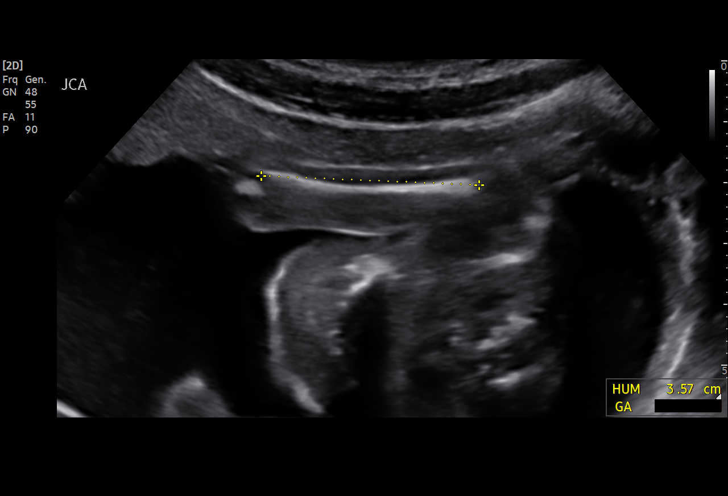
[im 29/72]
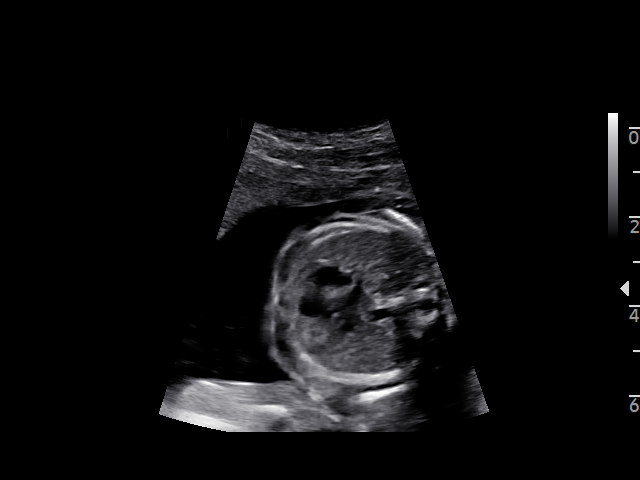
[im 37/72]
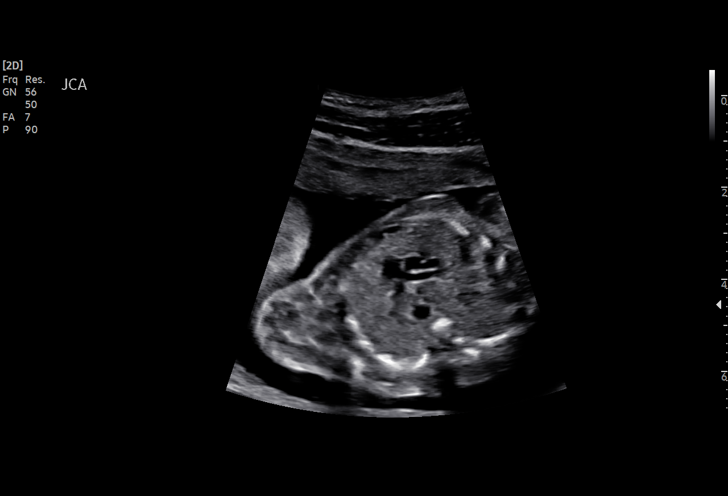
[im 43/72]
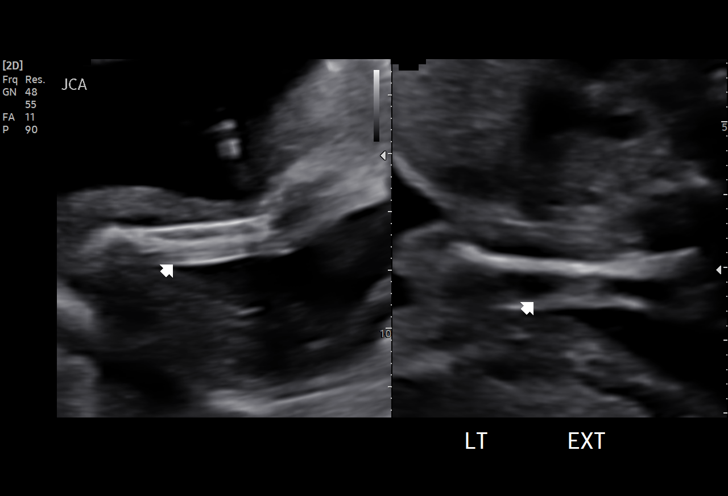
[im 48/72]
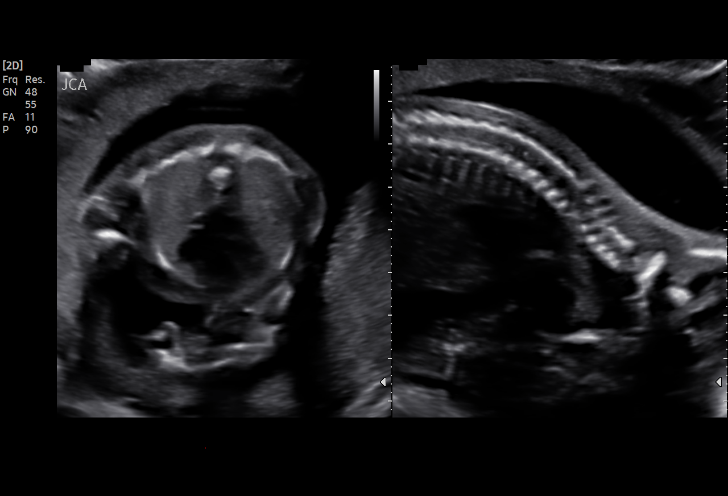
[im 53/72]
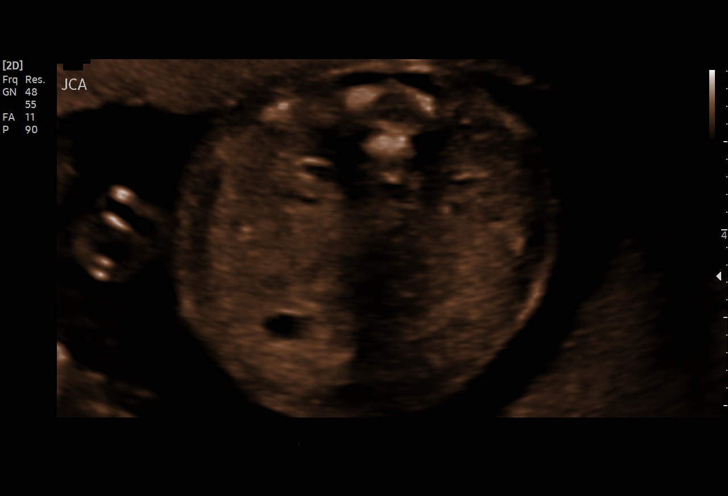
[im 58/72]
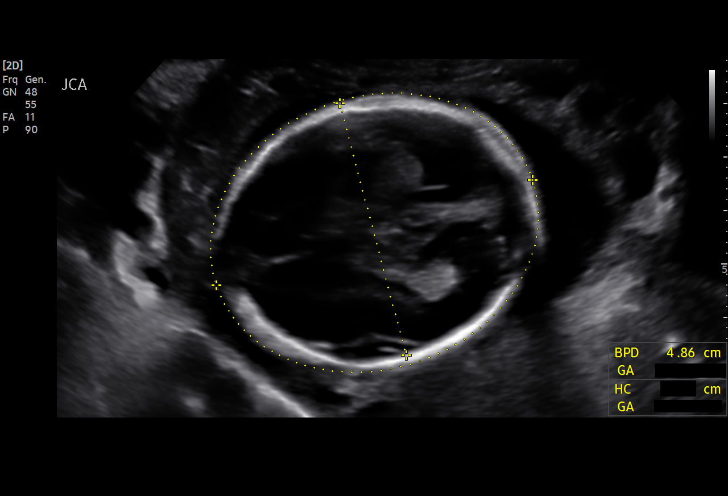
[im 64/72]
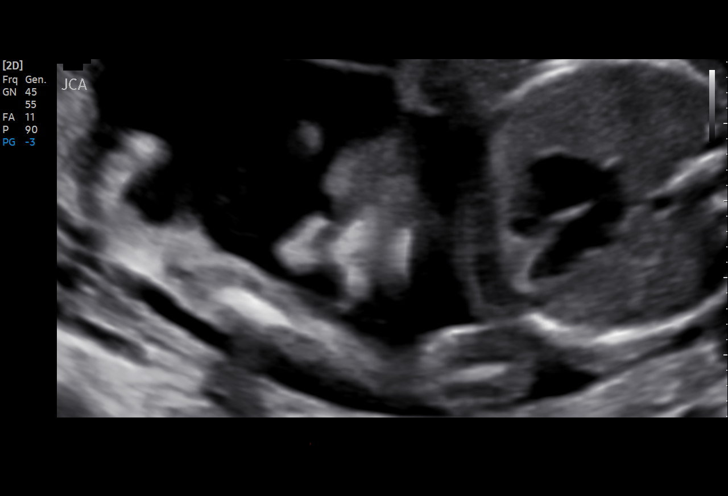
[im 69/72]
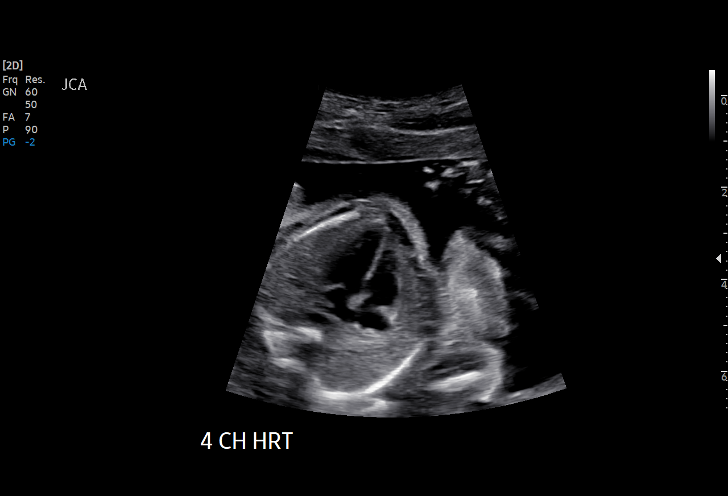

[13 of 28 positions shown; findings below may reference images not displayed]

CNM

Indications

 Encounter for antenatal screening for
 malformations (No GT Found)
 Insufficient Prenatal Care
 20 weeks gestation of pregnancy
Fetal Evaluation

 Num Of Fetuses:         1
 Fetal Heart Rate(bpm):  153
 Cardiac Activity:       Observed
 Presentation:           Transverse, head to maternal right
 Placenta:               Anterior Fundal
 P. Cord Insertion:      Visualized

 Amniotic Fluid
 AFI FV:      Within normal limits

                             Largest Pocket(cm)

Biometry

 BPD:        49  mm     G. Age:  20w 6d         60  %    CI:         75.3   %    70 - 86
                                                         FL/HC:      19.7   %    15.9 -
 HC:      179.1  mm     G. Age:  20w 2d         31  %    HC/AC:      1.21        1.06 -
 AC:      148.5  mm     G. Age:  20w 1d         29  %    FL/BPD:     71.8   %
 FL:       35.2  mm     G. Age:  21w 1d         61  %    FL/AC:      23.7   %    20 - 24
 HUM:      33.5  mm     G. Age:  21w 2d         70  %
 CER:      21.2  mm     G. Age:  20w 1d         42  %
 NFT:       4.0  mm

 LV:        6.9  mm
 CM:        4.7  mm

 Est. FW:     362  gm    0 lb 13 oz      44  %
OB History

 Gravidity:    2         Term:   0        Prem:   0        SAB:   1
 Living:       0
Gestational Age

 LMP:           24w 2d        Date:  05/14/19                 EDD:   02/18/20
 U/S Today:     20w 4d                                        EDD:   03/15/20
 Best:          20w 4d     Det. By:  U/S (10/31/19)           EDD:   03/15/20
Anatomy

 Cranium:               Appears normal         Aortic Arch:            Not well visualized
 Cavum:                 Appears normal         Ductal Arch:            Not well visualized
 Ventricles:            Appears normal         Diaphragm:              Appears normal
 Choroid Plexus:        Appears normal         Stomach:                Appears normal, left
                                                                       sided
 Cerebellum:            Appears normal         Abdomen:                Appears normal
 Posterior Fossa:       Appears normal         Abdominal Wall:         Appears nml (cord
                                                                       insert, abd wall)
 Nuchal Fold:           Appears normal         Cord Vessels:           Appears normal (3
                                                                       vessel cord)
 Face:                  Orbits nl; profile not Kidneys:                Appear normal
                        well visualized
 Lips:                  Appears normal         Bladder:                Not well visualized
 Thoracic:              Appears normal         Spine:                  Limited views
                                                                       appear normal
 Heart:                 Appears normal         Upper Extremities:      Appears normal
                        (4CH, axis, and
                        situs)
 RVOT:                  Appears normal         Lower Extremities:      Appears normal
 LVOT:                  Not well visualized

 Other:  Technically difficult due to fetal position.
Cervix Uterus Adnexa

 Cervix
 Length:           3.83  cm.
 Normal appearance by transabdominal scan.
Impression

 We performed fetal anatomy scan. No makers of
 aneuploidies or fetal structural defects are seen. Fetal
 biometry is consistent with 20w 4d.  Amniotic fluid is normal
 and good fetal activity is seen. Patient understands the
 limitations of ultrasound in detecting fetal anomalies.
 We have assigned her EDD at 03/15/20. No screening
 information is available.
Recommendations

 -An appointment was made for her to return in 4 weeks for
 completion of fetal anatomy.
                 Arg, Ninoush

## 2021-04-03 IMAGING — US US MFM OB FOLLOW-UP
1 series · 14 of 28 positions shown · non-contrast
Comparison: none

[Series 1: us mfm ob follow-up · 50 acquisitions, 14 frames shown]
[im 2/50]
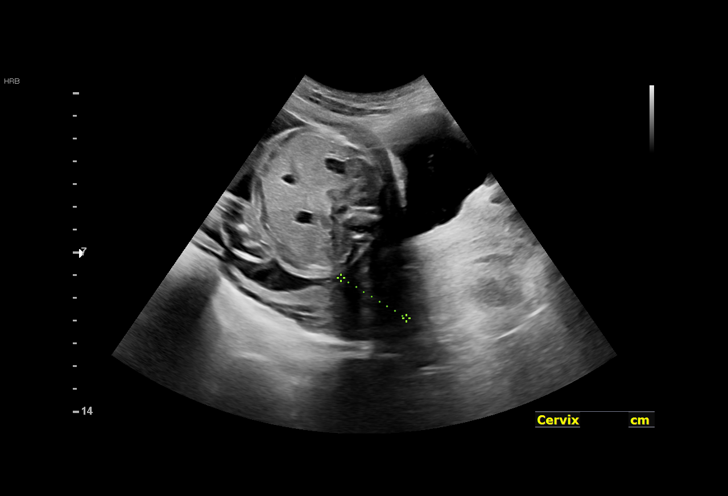
[im 6/50]
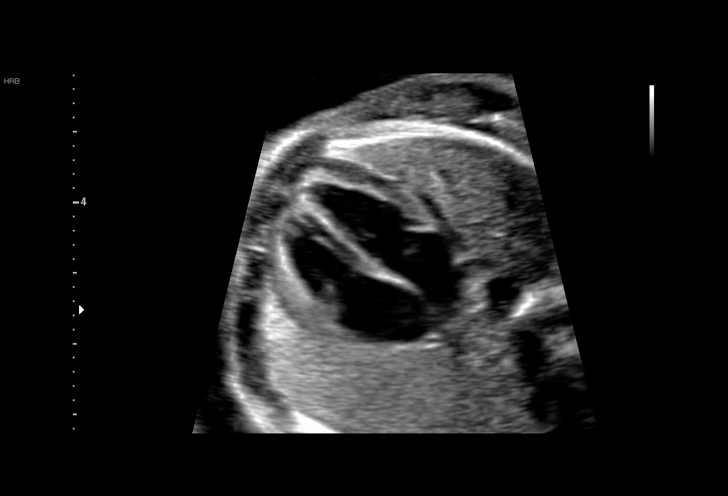
[im 10/50]
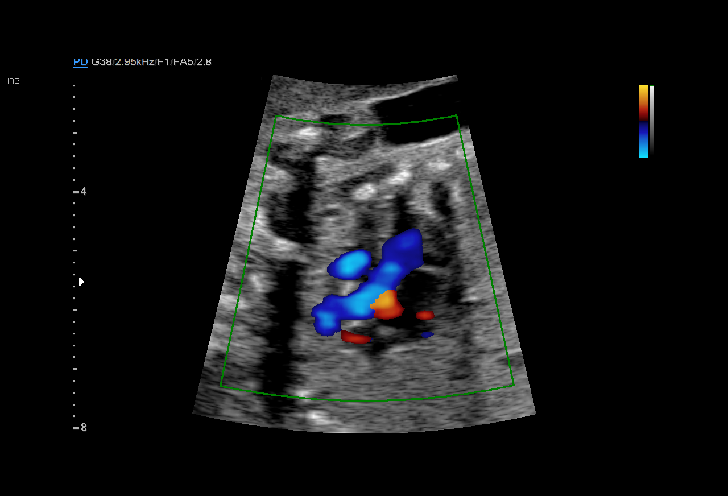
[im 13/50]
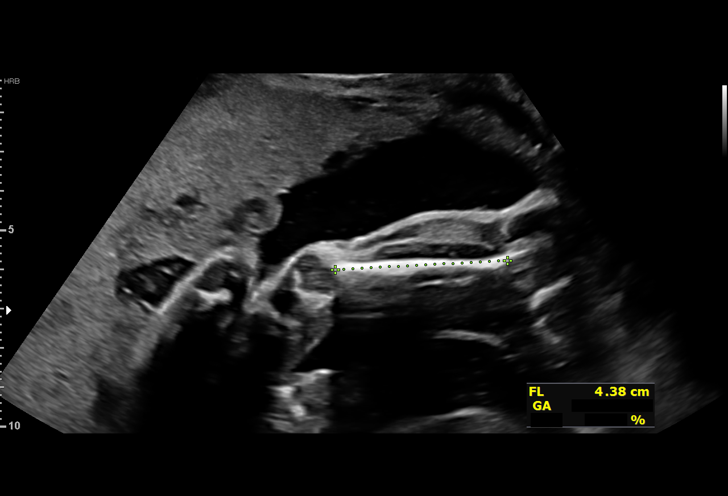
[im 17/50]
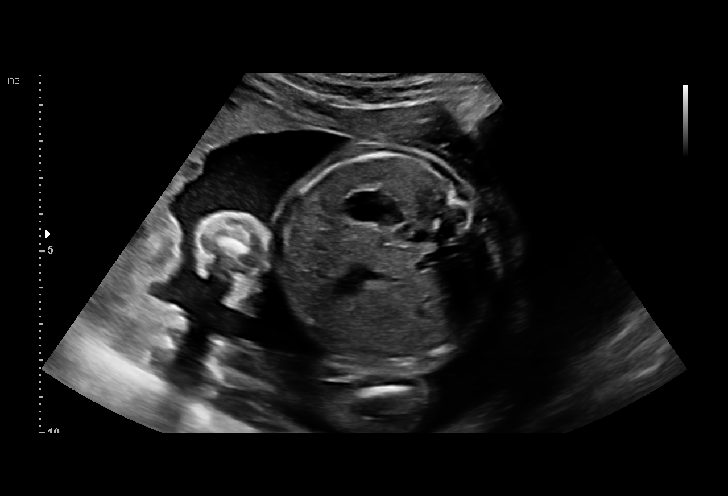
[im 20/50]
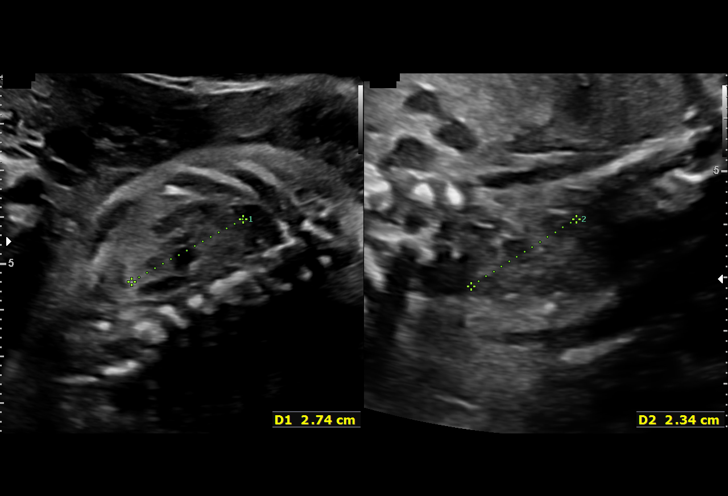
[im 24/50]
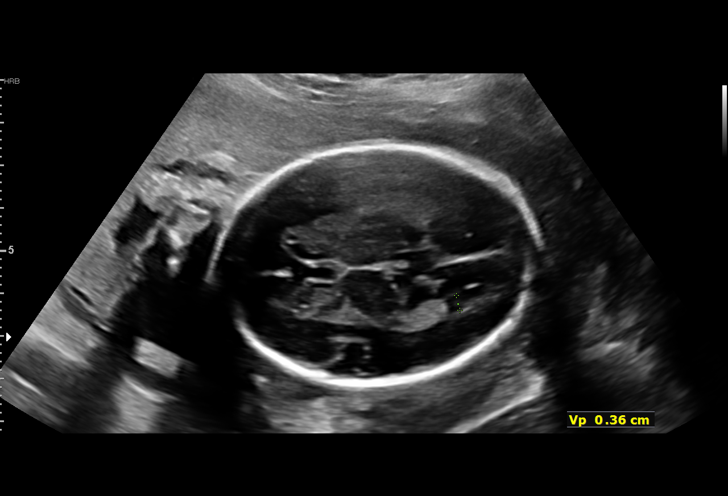
[im 28/50]
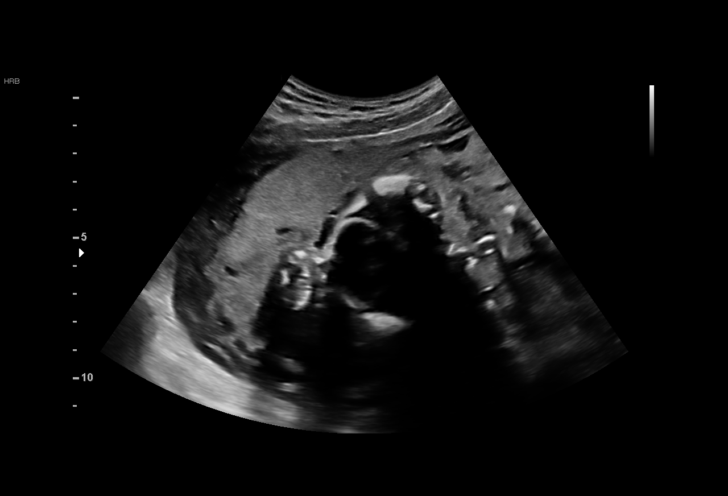
[im 31/50]
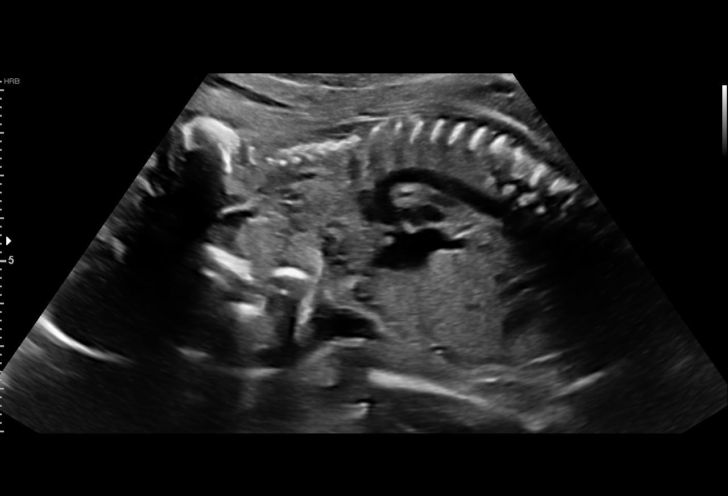
[im 35/50]
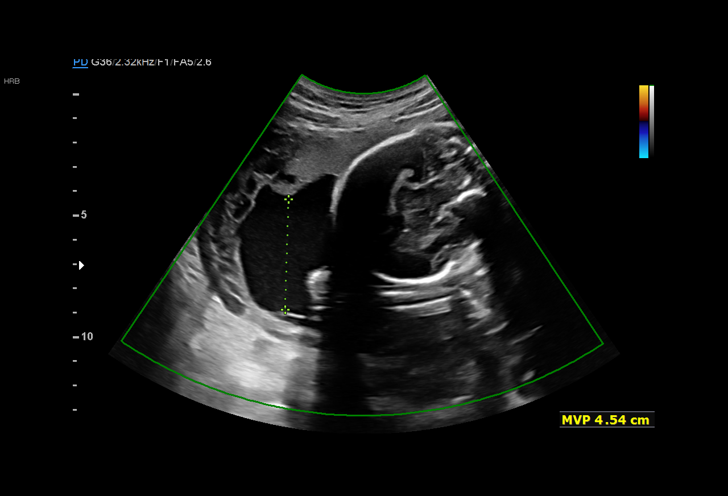
[im 39/50]
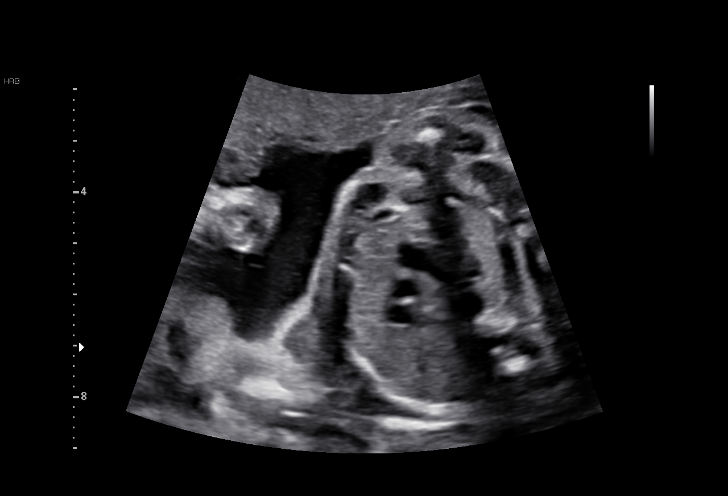
[im 42/50]
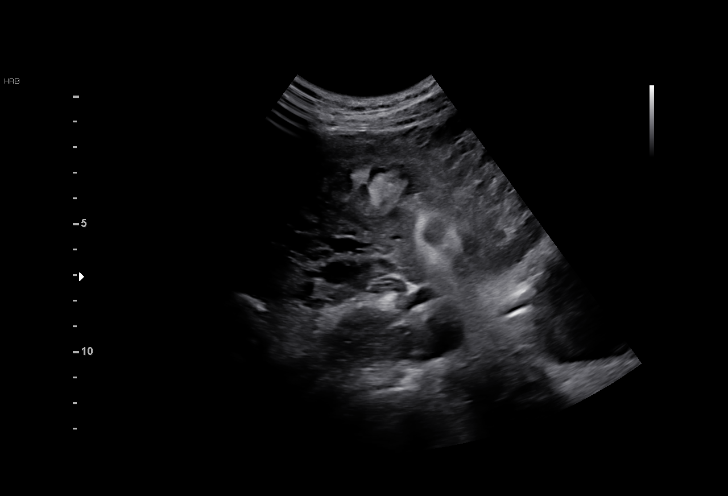
[im 46/50]
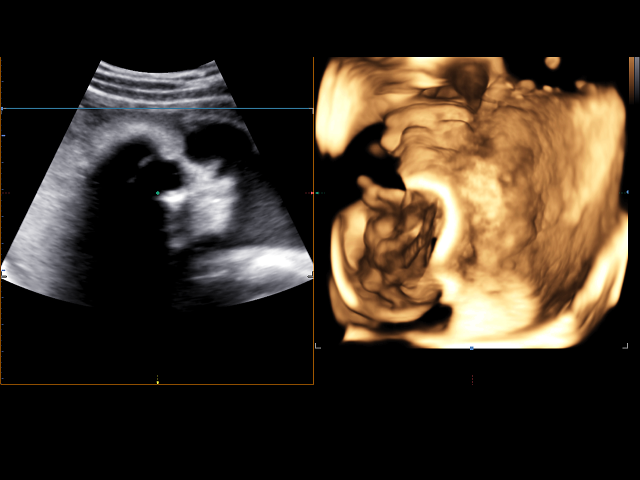
[im 50/50]
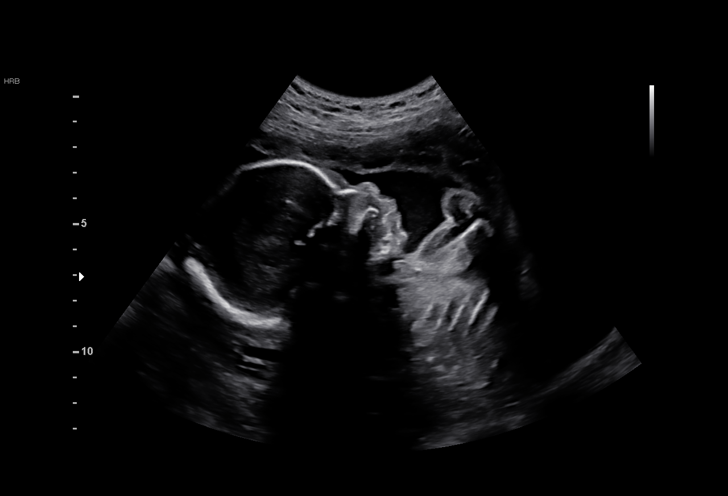

[14 of 28 positions shown; findings below may reference images not displayed]

CNM

Indications

 Encounter for other antenatal screening
 follow-up
 24 weeks gestation of pregnancy
Fetal Evaluation

 Num Of Fetuses:         1
 Fetal Heart Rate(bpm):  150
 Cardiac Activity:       Observed
 Presentation:           Oblique
 Placenta:               Anterior
 P. Cord Insertion:      Previously Visualized

 Amniotic Fluid
 AFI FV:      Within normal limits

                             Largest Pocket(cm)

Biometry

 BPD:      55.3  mm     G. Age:  22w 6d        3.1  %    CI:        70.37   %    70 - 86
                                                         FL/HC:      21.7   %    18.7 -
 HC:      210.2  mm     G. Age:  23w 1d          2  %    HC/AC:      1.05        1.04 -
 AC:      200.4  mm     G. Age:  24w 5d         44  %    FL/BPD:     82.5   %    71 - 87
 FL:       45.6  mm     G. Age:  25w 1d         54  %    FL/AC:      22.8   %    20 - 24
 LV:        3.6  mm

 Est. FW:     711  gm      1 lb 9 oz     40  %
OB History

 Gravidity:    2         Term:   0        Prem:   0        SAB:   1
 Living:       0
Gestational Age

 LMP:           28w 2d        Date:  05/14/19                 EDD:   02/18/20
 U/S Today:     24w 0d                                        EDD:   03/19/20
 Best:          24w 4d     Det. By:  U/S  (10/31/19)          EDD:   03/15/20
Anatomy

 Cranium:               Appears normal         Aortic Arch:            Appears normal
 Cavum:                 Appears normal         Ductal Arch:            Appears normal
 Ventricles:            Appears normal         Diaphragm:              Appears normal
 Choroid Plexus:        Previously seen        Stomach:                Appears normal, left
                                                                       sided
 Cerebellum:            Previously seen        Abdomen:                Appears normal
 Posterior Fossa:       Previously seen        Abdominal Wall:         Previously seen
 Nuchal Fold:           Previously seen        Cord Vessels:           Previously seen
 Face:                  Appears normal         Kidneys:                Appear normal
                        (orbits and profile)
 Lips:                  Appears normal         Bladder:                Appears normal
 Thoracic:              Appears normal         Spine:                  Limited views
                                                                       previously seen
 Heart:                 Appears normal         Upper Extremities:      Previously seen
                        (4CH, axis, and
                        situs)
 RVOT:                  Appears normal         Lower Extremities:      Previously seen
 LVOT:                  Appears normal

 Other:  Technically difficult due to fetal position.
Cervix Uterus Adnexa

 Cervix
 Length:           3.38  cm.
 Not visualized (advanced GA >93wks) Normal appearance by
 transabdominal scan.
Comments

 This patient was seen for a follow up exam as the views of
 the fetal anatomy were unable to be fully visualized during
 her last exam.  She denies any problems since her last exam.
 She was informed that the fetal growth and amniotic fluid
 level appears appropriate for her gestational age.
 The views of the fetal anatomy were visualized today.  There
 were no obvious anomalies noted.
 The limitations of ultrasound in the detection of all anomalies
 was discussed.
 Follow-up as indicated.

## 2022-09-03 ENCOUNTER — Ambulatory Visit
Admission: EM | Admit: 2022-09-03 | Discharge: 2022-09-03 | Disposition: A | Discharge status: Home / Self Care | Payer: Medicaid Other | Attending: Internal Medicine | Admitting: Internal Medicine

## 2022-09-03 DIAGNOSIS — H109 Unspecified conjunctivitis: Secondary | ICD-10-CM | POA: Diagnosis not present

## 2022-09-03 MED ORDER — ERYTHROMYCIN 5 MG/GM OP OINT: TOPICAL_OINTMENT | OPHTHALMIC | 0 refills | Status: AC

## 2022-09-03 NOTE — Discharge Instructions (Signed)
You have Pinkeye which is bacterial infection of the eye.  I have prescribed an antibiotic medication to go into the eye to treat this.  Change pillowcase and linen daily.  Follow-up if any symptoms persist or worsen.

## 2022-09-03 NOTE — ED Triage Notes (Signed)
Pt presents to [facility] with [facility] of right eye pinkness for 2 days. Using clear eye otc.

## 2022-09-03 NOTE — ED Provider Notes (Signed)
[facility]    CSN: [id number] Arrival date & time: 09/03/22  1202      History   Chief Complaint Chief Complaint  Patient presents with   Conjunctivitis    HPI Bethany Decker is a 22 y.o. female.   Patient presents with right eye redness, irritation, drainage that started about 2 days ago.  Her daughter who is also present in exam room was diagnosed with pinkeye today.  Patient also has associated nasal drainage.  Denies trauma or foreign body to the eye.  Denies blurry vision.  Patient does not wear corrective lenses.   Conjunctivitis    Past Medical History:  Diagnosis Date   Medical history non-contributory     Patient Active Problem List   Diagnosis Date Noted   Gestational HTN, third trimester 03/07/2020   Breech presentation 03/07/2020   Cesarean delivery delivered 03/07/2020   Elevated blood pressure affecting pregnancy in third trimester, antepartum 03/06/2020   Supervision of other normal pregnancy, antepartum 11/07/2019   Intentional drug overdose ([place])    MDD (major depressive disorder) 06/16/2019   Clonazepam overdose of undetermined intent    Suicidal ideation    Foster care (status) 04/28/2016    Past Surgical History:  Procedure Laterality Date   CESAREAN SECTION N/A 03/07/2020   Procedure: CESAREAN SECTION;  Surgeon: Woodroe Mode, MD;  Location: [facility] LD ORS;  Service: Obstetrics;  Laterality: N/A;   NO PAST SURGERIES      OB History     Gravida  2   Para  1   Term  1   Preterm      AB  1   Living  1      SAB  1   IAB      Ectopic      Multiple      Live Births  1            Home Medications    Prior to Admission medications   Medication Sig Start Date End Date Taking? Authorizing Provider  erythromycin ophthalmic ointment Place a 1/2 inch ribbon of ointment into the lower eyelid 4 times daily for 7 days. 09/03/22  Yes , Hildred Alamin E, FNP  ascorbic acid (VITAMIN C) 500 MG tablet Take 1 tablet one a  day by mouth every other day with an Iron tablet. Patient not taking: No sig reported 01/07/20   Laury Deep, CNM  docusate sodium (COLACE) 100 MG capsule Take 1 capsule (100 mg total) by mouth 2 (two) times daily as needed. Patient not taking: No sig reported 03/05/20   Darlina Rumpf, CNM  ferrous sulfate (FERROUSUL) 325 (65 FE) MG tablet Take 1 tablet by mouth twice a day every other day with Vitamin C tablet. Patient not taking: No sig reported 01/07/20   Laury Deep, CNM  hydrOXYzine (ATARAX/VISTARIL) 25 MG tablet Take 1 tablet (25 mg total) by mouth 3 (three) times daily as needed for anxiety. Patient not taking: No sig reported 06/20/19   Lindell Spar I, NP  ibuprofen (ADVIL) 800 MG tablet Take 1 tablet (800 mg total) by mouth every 8 (eight) hours. Patient not taking: No sig reported 03/10/20   Gabriel Carina, CNM  medroxyPROGESTERone (DEPO-PROVERA) 150 MG/ML injection Inject 1 mL (150 mg total) into the muscle every 3 (three) months. 09/18/20   Laury Deep, CNM  Prenatal Vit-Fe Fumarate-FA (MULTIVITAMIN-PRENATAL) 27-0.8 MG TABS tablet Take 1 tablet by mouth daily at 12 noon. Patient not taking: No sig reported  01/03/20   Laury Deep, CNM  senna-docusate (SENOKOT-S) 8.6-50 MG tablet Take 2 tablets by mouth daily. Patient not taking: No sig reported 03/11/20   Gabriel Carina, CNM  simethicone (MYLICON) 80 MG chewable tablet Chew 1 tablet (80 mg total) by mouth as needed for flatulence. Patient not taking: No sig reported 03/10/20   Gabriel Carina, CNM    Family History History reviewed. No pertinent family history.  Social History Social History   Tobacco Use   Smoking status: Former    Types: Cigars, Cigarettes   Smokeless tobacco: Never   Tobacco comments:    stopped when found out pregnant around 20 weeks  Vaping Use   Vaping Use: Never used  Substance Use Topics   Alcohol use: Not Currently    Comment: stopped when found out pregnant around 20 weeks    Drug use: Not Currently    Types: Marijuana    Comment: stopped when found out pregnant around 20 weeks     Allergies   Patient has no known allergies.   Review of Systems Review of Systems Per HPI  Physical Exam Triage Vital Signs ED Triage Vitals  Enc Vitals Group     BP 09/03/22 1220 129/82     Pulse Rate 09/03/22 1220 71     Resp 09/03/22 1220 16     Temp 09/03/22 1220 98.3 F (36.8 C)     Temp src --      SpO2 09/03/22 1220 98 %     Weight --      Height --      Head Circumference --      Peak Flow --      Pain Score 09/03/22 1219 4     Pain Loc --      Pain Edu? --      Excl. in [place]? --    No data found.  Updated Vital Signs BP 129/82   Pulse 71   Temp 98.3 F (36.8 C)   Resp 16   LMP 08/24/2022   SpO2 98%   Visual Acuity Right Eye Distance:   Left Eye Distance:   Bilateral Distance:    Right Eye Near:   Left Eye Near:    Bilateral Near:     Physical Exam Constitutional:      General: She is not in acute distress.    Appearance: Normal appearance. She is not toxic-appearing or diaphoretic.  HENT:     Head: Normocephalic and atraumatic.  Eyes:     General: Lids are normal. Lids are everted, no foreign bodies appreciated. Vision grossly intact. Gaze aligned appropriately.     Extraocular Movements: Extraocular movements intact.     Conjunctiva/sclera:     Right eye: Right conjunctiva is injected. Chemosis and exudate present. No hemorrhage.    Left eye: Left conjunctiva is not injected. No chemosis or exudate.    Pupils: Pupils are equal, round, and reactive to light.     Comments: Scleral redness throughout.   Pulmonary:     Effort: Pulmonary effort is normal.  Neurological:     General: No focal deficit present.     Mental Status: She is alert and oriented to person, place, and time. Mental status is at baseline.  Psychiatric:        Mood and Affect: Mood normal.        Behavior: Behavior normal.        Thought Content: Thought  content normal.  Judgment: Judgment normal.      UC Treatments / Results  Labs (all labs ordered are listed, but only abnormal results are displayed) Labs Reviewed - No data to display  EKG   Radiology No results found.  Procedures Procedures (including critical care time)  Medications Ordered in UC Medications - No data to display  Initial Impression / Assessment and Plan / UC Course  I have reviewed the triage vital signs and the nursing notes.  Pertinent labs & imaging results that were available during my care of the patient were reviewed by me and considered in my medical decision making (see chart for details).     Physical exam is consistent with bacterial conjunctivitis of the right eye especially given close exposure.  Will treat with erythromycin ointment.  Advised supportive care and symptom management.  Visual acuity appears normal.  Advised strict follow-up precautions if symptoms persist or worsen.  Patient verbalized understanding and was agreeable with plan. Final Clinical Impressions(s) / UC Diagnoses   Final diagnoses:  Bacterial conjunctivitis of right eye     Discharge Instructions      You have Pinkeye which is bacterial infection of the eye.  I have prescribed an antibiotic medication to go into the eye to treat this.  Change pillowcase and linen daily.  Follow-up if any symptoms persist or worsen.    ED Prescriptions     Medication Sig Dispense Auth. Provider   erythromycin ophthalmic ointment Place a 1/2 inch ribbon of ointment into the lower eyelid 4 times daily for 7 days. 3.5 g Teodora Medici, [place]      PDMP not reviewed this encounter.   Teodora Medici, [place] 09/03/22 1258

## 2022-09-15 ENCOUNTER — Emergency Department (HOSPITAL_COMMUNITY)
Admission: EM | Admit: 2022-09-15 | Discharge: 2022-09-15 | Disposition: A | Discharge status: Home / Self Care | Payer: Medicaid Other | Attending: Emergency Medicine | Admitting: Emergency Medicine

## 2022-09-15 ENCOUNTER — Encounter (HOSPITAL_COMMUNITY): Payer: Self-pay | Admitting: *Deleted

## 2022-09-15 ENCOUNTER — Other Ambulatory Visit: Payer: Self-pay

## 2022-09-15 DIAGNOSIS — J029 Acute pharyngitis, unspecified: Secondary | ICD-10-CM | POA: Diagnosis present

## 2022-09-15 DIAGNOSIS — R590 Localized enlarged lymph nodes: Secondary | ICD-10-CM | POA: Insufficient documentation

## 2022-09-15 LAB — GROUP A STREP BY PCR: Group A Strep by PCR: NOT DETECTED

## 2022-09-15 MED ORDER — DOXYCYCLINE HYCLATE 100 MG PO CAPS: 100.0000 mg | ORAL_CAPSULE | Freq: Two times a day (BID) | ORAL | 0 refills | Status: AC

## 2022-09-15 MED ORDER — LIDOCAINE VISCOUS HCL 2 % MT SOLN
15.0000 mL | Freq: Once | OROMUCOSAL | Status: AC
  Administered 2022-09-15: 15 mL via OROMUCOSAL
  Filled 2022-09-15: qty 15

## 2022-09-15 MED ORDER — PREDNISONE 20 MG PO TABS: ORAL_TABLET | ORAL | 0 refills | Status: AC

## 2022-09-15 NOTE — ED Provider Notes (Signed)
[place] EMERGENCY DEPARTMENT AT [facility] Provider Note   CSN: [id number] Arrival date & time: 09/15/22  0201     History  No chief complaint on file.   Bethany Decker is a 22 y.o. female.  The history is provided by the patient and medical records. No language interpreter was used.     22 year old female significant hx of depression, intentional drug overdose, who presents with complaints of sore throat.  Patient report for the past week she has had congestion, sore throat and now with ear pain and increased difficulty swallowing.  Symptoms moderate in severity and pains is affecting her neck.  No fever or chills no chest pain or shortness of breath no abdominal pain.  She tries over-the-counter medication without adequate relief.  She denies swallowing any foreign body.  She thought it could be an infection.  Home Medications Prior to Admission medications   Medication Sig Start Date End Date Taking? Authorizing Provider  ascorbic acid (VITAMIN C) 500 MG tablet Take 1 tablet one a day by mouth every other day with an Iron tablet. Patient not taking: No sig reported 01/07/20   Laury Deep, CNM  docusate sodium (COLACE) 100 MG capsule Take 1 capsule (100 mg total) by mouth 2 (two) times daily as needed. Patient not taking: No sig reported 03/05/20   Mallie Snooks C, CNM  erythromycin ophthalmic ointment Place a 1/2 inch ribbon of ointment into the lower eyelid 4 times daily for 7 days. 09/03/22   Teodora Medici, FNP  ferrous sulfate (FERROUSUL) 325 (65 FE) MG tablet Take 1 tablet by mouth twice a day every other day with Vitamin C tablet. Patient not taking: No sig reported 01/07/20   Laury Deep, CNM  hydrOXYzine (ATARAX/VISTARIL) 25 MG tablet Take 1 tablet (25 mg total) by mouth 3 (three) times daily as needed for anxiety. Patient not taking: No sig reported 06/20/19   Lindell Spar I, NP  ibuprofen (ADVIL) 800 MG tablet Take 1 tablet (800 mg total) by mouth  every 8 (eight) hours. Patient not taking: No sig reported 03/10/20   Gabriel Carina, CNM  medroxyPROGESTERone (DEPO-PROVERA) 150 MG/ML injection Inject 1 mL (150 mg total) into the muscle every 3 (three) months. 09/18/20   Laury Deep, CNM  Prenatal Vit-Fe Fumarate-FA (MULTIVITAMIN-PRENATAL) 27-0.8 MG TABS tablet Take 1 tablet by mouth daily at 12 noon. Patient not taking: No sig reported 01/03/20   Laury Deep, CNM  senna-docusate (SENOKOT-S) 8.6-50 MG tablet Take 2 tablets by mouth daily. Patient not taking: No sig reported 03/11/20   Gabriel Carina, CNM  simethicone (MYLICON) 80 MG chewable tablet Chew 1 tablet (80 mg total) by mouth as needed for flatulence. Patient not taking: No sig reported 03/10/20   Gabriel Carina, CNM      Allergies    Patient has no known allergies.    Review of Systems   Review of Systems  All other systems reviewed and are negative.   Physical Exam Updated Vital Signs BP (!) 147/106 (BP Location: Right Arm)   Pulse 74   Temp 98.8 F (37.1 C) (Oral)   Resp 16   LMP 08/24/2022   SpO2 99%  Physical Exam Vitals and nursing note reviewed.  Constitutional:      General: She is not in acute distress.    Appearance: She is well-developed.  HENT:     Head: Atraumatic.     Right Ear: Tympanic membrane normal.     Left  Ear: Tympanic membrane normal.     Nose: Nose normal.     Mouth/Throat:     Comments: Mild trismus noted.  Uvula midline, mild posterior oropharyngeal erythema.  Bilateral tonsillar enlargement without exudates.  No sublingual swelling. Eyes:     Conjunctiva/sclera: Conjunctivae normal.  Cardiovascular:     Rate and Rhythm: Normal rate and regular rhythm.  Pulmonary:     Effort: Pulmonary effort is normal.  Abdominal:     Palpations: Abdomen is soft.     Tenderness: There is no abdominal tenderness.  Musculoskeletal:     Cervical back: Normal range of motion and neck supple. Tenderness (Tenderness to palpation of anterior  neck bilaterally.) present.  Lymphadenopathy:     Cervical: Cervical adenopathy present.  Skin:    Findings: No rash.  Neurological:     Mental Status: She is alert.  Psychiatric:        Mood and Affect: Mood normal.     ED Results / Procedures / Treatments   Labs (all labs ordered are listed, but only abnormal results are displayed) Labs Reviewed - No data to display  EKG None  Radiology No results found.  Procedures Procedures    Medications Ordered in ED Medications - No data to display  ED Course/ Medical Decision Making/ A&P                             Medical Decision Making Risk Prescription drug management.   BP (!) 147/106 (BP Location: Right Arm)   Pulse 74   Temp 98.8 F (37.1 C) (Oral)   Resp 16   LMP 08/24/2022   SpO2 99%   31:51 AM 22 year old female significant hx of depression, intentional drug overdose, who presents with complaints of sore throat.  Patient report for the past week she has had congestion, sore throat and now with ear pain and increased difficulty swallowing.  Symptoms moderate in severity and pains is affecting her neck.  No fever or chills no chest pain or shortness of breath no abdominal pain.  She tries over-the-counter medication without adequate relief.  She denies swallowing any foreign body.  She thought it could be an infection.  On exam, this is a well-appearing female.  She does have some difficulty speaking due to sore throat.  Ear exam unremarkable with normal TMs bilaterally nose exam normal.  On examination of her throat she does have some mild trismus.  Uvula is midline bilateral tonsillar enlargement without exudates.  Patient presenting with sore throat consistent with viral pharyngitis.  2 out of 4 [facility] criteria.  The patient did not have trismus, hot potato voice, uvula deviation, unilateral tonsillar swelling, toxic appearance, drooling or pain with movement of the trachea to suggest peritonsillar abscess or  epiglottitis.  No evidence of bacterial infections including peritonsillar abscess, retropharyngeal abscess, epiglottitis.  Rapid strep was obtained and was negative decreasing the likelihood of bacterial pharyngitis (A rapid strep test has a 95% sensitivity and a specificity of 98%). While in ED patient was provided with viscous lidocaine. Vital signs stable. Patient advised to continue ibuprofen and Tylenol at home.  Impression: Sore Throat   Plan:    * Discharge from ED   * Advised Pt on supportive therapies, including using a cool-mist vaporizer/humidifer/steam from hot showers, limit talking, OTC throat lozenges and mouthwashes qd, gargling w/ warm saltwater, advancement of fluids as tolerated, nasal saline sprays, rest, OTC acetaminophen or ibuprofen as directed  prn for pain control, frequent handwashing, and boiling/disposing of contaminated toothbrushes.   * Instructed Pt to f/up w/ PCP or ER should Sx worsen or not improve. Pt verbally expressed understanding and all questions were addressed to Pt's satisfaction.   * Informed to return to ER if has new or worsening symptoms.         Final Clinical Impression(s) / ED Diagnoses Final diagnoses:  Sore throat    Rx / DC Orders ED Discharge Orders          Ordered    predniSONE (DELTASONE) 20 MG tablet        09/15/22 0641    doxycycline (VIBRAMYCIN) 100 MG capsule  2 times daily        09/15/22 0641              Domenic Moras, PA-C 09/15/22 [id number]    Quintella Reichert, MD 09/15/22 [id number]

## 2022-09-15 NOTE — Discharge Instructions (Addendum)
Please take medication prescribed for your symptoms.  Follow-up with your doctor for further care.  Return to ER if you have any concern.

## 2022-09-15 NOTE — ED Triage Notes (Signed)
Pt c/o sore throat; reports thinking a piece of shell is stuck in her throat

## 2023-01-05 ENCOUNTER — Telehealth: Payer: Self-pay | Admitting: *Deleted

## 2023-01-05 NOTE — Telephone Encounter (Signed)
Please call and advise patient

## 2023-01-06 NOTE — Telephone Encounter (Signed)
Tried to reach pt several times to advise about concern about insurance per nurse A.E.'s CRM and to let her know she can be seen for her upcoming New Patient appt even though her insurance card does not reflect chosen provider's name and that we currently accept her insurance. Could not reach pt due to phone ringing and then having a busy tone.

## 2023-02-02 ENCOUNTER — Encounter: Payer: Medicaid Other | Admitting: Family

## 2023-02-02 NOTE — Progress Notes (Signed)
Erroneous encounter-disregard

## 2023-02-18 ENCOUNTER — Encounter: Payer: Medicaid Other | Admitting: Family

## 2023-02-18 NOTE — Progress Notes (Signed)
Erroneous encounter-disregard
